# Patient Record
Sex: Female | Born: 1990 | Race: White | Hispanic: No | Marital: Married | Smoking: Never smoker
Health system: Southern US, Community
[De-identification: ages and names within clinical notes are randomized; demographics above are authoritative.]

## PROBLEM LIST (undated history)

## (undated) DIAGNOSIS — G8929 Other chronic pain: Secondary | ICD-10-CM

## (undated) DIAGNOSIS — N200 Calculus of kidney: Secondary | ICD-10-CM

## (undated) DIAGNOSIS — R51 Headache: Secondary | ICD-10-CM

## (undated) DIAGNOSIS — K8020 Calculus of gallbladder without cholecystitis without obstruction: Secondary | ICD-10-CM

## (undated) DIAGNOSIS — O24419 Gestational diabetes mellitus in pregnancy, unspecified control: Secondary | ICD-10-CM

## (undated) HISTORY — DX: Headache: R51

## (undated) HISTORY — PX: CHOLECYSTECTOMY: SHX55

## (undated) HISTORY — DX: Calculus of kidney: N20.0

## (undated) HISTORY — DX: Calculus of gallbladder without cholecystitis without obstruction: K80.20

## (undated) HISTORY — DX: Other chronic pain: G89.29

## (undated) HISTORY — PX: WISDOM TOOTH EXTRACTION: SHX21

---

## 2013-02-06 ENCOUNTER — Encounter: Payer: Self-pay | Admitting: Gastroenterology

## 2013-02-21 ENCOUNTER — Other Ambulatory Visit (INDEPENDENT_AMBULATORY_CARE_PROVIDER_SITE_OTHER): Payer: 59

## 2013-02-21 ENCOUNTER — Encounter: Payer: Self-pay | Admitting: Gastroenterology

## 2013-02-21 ENCOUNTER — Ambulatory Visit (INDEPENDENT_AMBULATORY_CARE_PROVIDER_SITE_OTHER): Payer: 59 | Admitting: Gastroenterology

## 2013-02-21 VITALS — BP 112/70 | HR 80 | Ht 60.0 in | Wt 171.0 lb

## 2013-02-21 DIAGNOSIS — K625 Hemorrhage of anus and rectum: Secondary | ICD-10-CM

## 2013-02-21 DIAGNOSIS — K5900 Constipation, unspecified: Secondary | ICD-10-CM

## 2013-02-21 LAB — CBC WITH DIFFERENTIAL/PLATELET
Basophils Relative: 0.3 % (ref 0.0–3.0)
Eosinophils Relative: 1.6 % (ref 0.0–5.0)
HCT: 39.5 % (ref 36.0–46.0)
Hemoglobin: 13.5 g/dL (ref 12.0–15.0)
Lymphs Abs: 1.5 10*3/uL (ref 0.7–4.0)
MCV: 88.2 fl (ref 78.0–100.0)
Monocytes Absolute: 0.3 10*3/uL (ref 0.1–1.0)
Neutro Abs: 4.1 10*3/uL (ref 1.4–7.7)
Platelets: 271 10*3/uL (ref 150.0–400.0)
WBC: 6 10*3/uL (ref 4.5–10.5)

## 2013-02-21 LAB — BASIC METABOLIC PANEL
BUN: 10 mg/dL (ref 6–23)
Calcium: 9.1 mg/dL (ref 8.4–10.5)
Creatinine, Ser: 0.7 mg/dL (ref 0.4–1.2)
GFR: 107.61 mL/min (ref >60.00)
Glucose, Bld: 121 mg/dL — ABNORMAL HIGH (ref 70–99)
Sodium: 136 mEq/L (ref 135–145)

## 2013-02-21 MED ORDER — LINACLOTIDE 290 MCG PO CAPS: 290.0000 ug | ORAL_CAPSULE | Freq: Every day | ORAL | Status: DC

## 2013-02-21 NOTE — Progress Notes (Signed)
HPI: This is a   very pleasant 22 year old woman who is here with her mother today. I am meeting her for the first time.  GB resected in [facility] about a year ago.  Has gained 50 pounds since then.  Alternating constipation, diarrhea.  A lot of bloating.  Can have to sit and push, strain.   Has tried laxatives, enemas.  She tried linzess for a few days but effecacy seemed to wane.  Always feels constiptated.  Has seen blood in stool at times, small red blood at times.  Not sure if her thyroid level has been checked in past year.  Can have pyrosis as well.  Can effect her daily at times.  She has taken TUMs in past with good relief.   Review of systems: Pertinent positive and negative review of systems were noted in the above HPI section. Complete review of systems was performed and was otherwise normal.    Past Medical History  Diagnosis Date  . Chronic headaches   . Gallstones     Past Surgical History  Procedure Laterality Date  . Cholecystectomy      Current Outpatient Prescriptions  Medication Sig Dispense Refill  . Linaclotide (LINZESS) 145 MCG CAPS Take 145 mcg by mouth daily.      . norgestrel-ethinyl estradiol (LO/OVRAL,CRYSELLE) 0.3-30 MG-MCG tablet Take 1 tablet by mouth daily.       No current facility-administered medications for this visit.    Allergies as of 02/21/2013  . (Not on File)    Family History  Problem Relation Age of Onset  . Breast cancer Paternal Aunt   . Prostate cancer Maternal Grandfather   . Colon cancer Paternal Aunt   . Lung cancer Paternal Aunt   . Lung cancer Maternal Grandmother   . Diabetes Father   . Diabetes Maternal Aunt   . Diabetes Maternal Grandfather   . Heart disease Maternal Grandmother     History   Social History  . Marital Status: Single    Spouse Name: N/A    Number of Children: 0  . Years of Education: N/A   Occupational History  . Not on file.   Social History Main Topics  . Smoking status:  Never Smoker   . Smokeless tobacco: Never Used  . Alcohol Use: No  . Drug Use: No  . Sexually Active: Not on file   Other Topics Concern  . Not on file   Social History Narrative  . No narrative on file       Physical Exam: BP 112/70  Pulse 80  Ht 5' (1.524 m)  Wt 171 lb (77.565 kg)  BMI 33.4 kg/m2  SpO2 98%  LMP 02/12/2013 Constitutional: generally well-appearing Psychiatric: alert and oriented x3 Eyes: extraocular movements intact Mouth: oral pharynx moist, no lesions Neck: supple no lymphadenopathy Cardiovascular: heart regular rate and rhythm Lungs: clear to auscultation bilaterally Abdomen: soft, nontender, nondistended, no obvious ascites, no peritoneal signs, normal bowel sounds Extremities: no lower extremity edema bilaterally Skin: no lesions on visible extremities    Assessment and plan: 22 y.o. female with  chronic constipation, mild intermittent rectal bleeding, extreme weight gain this past year  First I recommended that she try to cut back on her fluid intake since she has gained 50 pounds in past year. I explained this can cause a variety of GI and other systemic symptoms. She has been on linzess at low dose and I'm going to increase this to 290 mcg pills. She'll take  1 pill once daily. She'll also start fiber supplements. We will proceed with flexible sigmoidoscopy at her soonest convenience given her constipation and her intermittent rectal bleeding. She will also get a basic set of labs including thyroid function, CBC, basic metabolic profile.

## 2013-02-21 NOTE — Patient Instructions (Addendum)
You need to try to lose weight, portion control is most helpful. You will have labs checked today in the basement lab.  Please head down after you check out with the front desk  ( tsh level, cbc, bmet). You will be set up for a flexible sigmoidoscopy (LEC, MAC) for constipation, bleeding. New prescription for linzess, higher dose.  Take one pill once daily for constipation. Please start taking citrucel (orange flavored) powder fiber supplement.  This may cause some bloating at first but that usually goes away. Begin with a small spoonful and work your way up to a large, heaping spoonful daily over a week.                                               We are excited to introduce MyChart, a new best-in-class service that provides you online access to important information in your electronic medical record. We want to make it easier for you to view your health information - all in one secure location - when and where you need it. We expect MyChart will enhance the quality of care and service we provide.  When you register for MyChart, you can:    View your test results.    Request appointments and receive appointment reminders via email.    Request medication renewals.    View your medical history, allergies, medications and immunizations.    Communicate with your physician's office through a password-protected site.    Conveniently print information such as your medication lists.  To find out if MyChart is right for you, please talk to a member of our clinical staff today. We will gladly answer your questions about this free health and wellness tool.  If you are age 21 or older and want a member of your family to have access to your record, you must provide written consent by completing a proxy form available at our office. Please speak to our clinical staff about guidelines regarding accounts for patients younger than age 83.  As you activate your MyChart account and need any technical  assistance, please call the MyChart technical support line at [phone]) or email your question to [email or web address]. If you email your question(s), please include your name, a return phone number and the best time to reach you.  If you have non-urgent health-related questions, you can send a message to our office through MyChart at [email or web address]. If you have a medical emergency, call 911.  Thank you for using MyChart as your new health and wellness resource!   MyChart licensed from [employer],  [id number]. Patents Pending.

## 2013-03-24 ENCOUNTER — Ambulatory Visit (AMBULATORY_SURGERY_CENTER): Payer: 59 | Admitting: Gastroenterology

## 2013-03-24 ENCOUNTER — Encounter: Payer: Self-pay | Admitting: Gastroenterology

## 2013-03-24 VITALS — BP 127/77 | HR 64 | Temp 97.5°F | Resp 20 | Ht 60.0 in | Wt 171.0 lb

## 2013-03-24 DIAGNOSIS — K5900 Constipation, unspecified: Secondary | ICD-10-CM

## 2013-03-24 DIAGNOSIS — K625 Hemorrhage of anus and rectum: Secondary | ICD-10-CM

## 2013-03-24 MED ORDER — SODIUM CHLORIDE 0.9 % IV SOLN: 500.0000 mL | INTRAVENOUS | Status: DC

## 2013-03-24 NOTE — Progress Notes (Signed)
Lidocaine-40mg IV prior to Propofol InductionPropofol given over incremental dosages 

## 2013-03-24 NOTE — Op Note (Signed)
[facility] [address] [place], [address]   FLEX SIGMOIDOSCOPY PROCEDURE REPORT  PATIENT: Sherry, Johnson  MR#: [id number] BIRTHDATE: June 04, 1991 , 21  yrs. old GENDER: Female ENDOSCOPIST: Rachael Fee, MD REFERRED XB:JYNWGN John, M.D. PROCEDURE DATE:  03/24/2013 PROCEDURE:   Sigmoidoscopy, diagnostic INDICATIONS:constipation, minor rectal bleeding. MEDICATIONS: MAC sedation, administered by CRNA and propofol (Diprivan) 150mg  IV  DESCRIPTION OF PROCEDURE:    Physical exam was performed.  Informed consent was obtained from the patient after explaining the benefits, risks, and alternatives to procedure.  The patient was connected to monitor and placed in left lateral position. Continuous oxygen was provided by nasal cannula and IV medicine administered through an indwelling cannula.  After administration of sedation and rectal exam, the patients rectum was intubated and the [id number]  colonoscope was advanced under direct visualization to the transverse colon.  The scope was removed slowly by carefully examining the color, texture, anatomy, and integrity mucosa on the way out.  The patient was recovered in endoscopy and discharged home in satisfactory condition.    COLON FINDINGS: The examination was normal.  PREP QUALITY: good  CECAL W/D TIME: NA  COMPLICATIONS: None  ENDOSCOPIC IMPRESSION: The examination was normal.   RECOMMENDATIONS: Please continue once daily linzess and try to take citrucel on a daily basis as well.  My office will contact you about a follow up appt in 4-5 weeks.       _______________________________ eSignedRachael Fee, MD 03/24/2013 8:58 AM

## 2013-03-24 NOTE — Patient Instructions (Addendum)

## 2013-03-24 NOTE — Progress Notes (Signed)
Patient did not experience any of the following events: a burn prior to discharge; a fall within the facility; wrong site/side/patient/procedure/implant event; or a hospital transfer or hospital admission upon discharge from the facility. (G8907) Patient did not have preoperative order for IV antibiotic SSI prophylaxis. (G8918)  

## 2013-03-27 ENCOUNTER — Telehealth: Payer: Self-pay | Admitting: *Deleted

## 2013-03-27 NOTE — Telephone Encounter (Signed)
  Follow up Call-  Call back number 03/24/2013  Post procedure Call Back phone  # [phone]  Permission to leave phone message Yes     Patient questions:  Do you have a fever, pain , or abdominal swelling? no Pain Score  0 *  Have you tolerated food without any problems? yes  Have you been able to return to your normal activities? yes  Do you have any questions about your discharge instructions: Diet   no Medications  no Follow up visit  no  Do you have questions or concerns about your Care? no  Actions: * If pain score is 4 or above: No action needed, pain <4.

## 2013-04-23 DIAGNOSIS — N200 Calculus of kidney: Secondary | ICD-10-CM

## 2013-04-23 HISTORY — DX: Calculus of kidney: N20.0

## 2013-04-25 ENCOUNTER — Ambulatory Visit: Payer: 59 | Admitting: Gastroenterology

## 2013-04-25 ENCOUNTER — Telehealth: Payer: Self-pay | Admitting: Gastroenterology

## 2013-04-25 NOTE — Telephone Encounter (Signed)
Do not bill 

## 2013-04-25 NOTE — Telephone Encounter (Signed)
Error

## 2013-05-11 ENCOUNTER — Emergency Department (HOSPITAL_COMMUNITY)
Admission: EM | Admit: 2013-05-11 | Discharge: 2013-05-11 | Disposition: A | Discharge status: Home / Self Care | Payer: 59 | Attending: Emergency Medicine | Admitting: Emergency Medicine

## 2013-05-11 ENCOUNTER — Encounter (HOSPITAL_COMMUNITY): Payer: Self-pay | Admitting: Emergency Medicine

## 2013-05-11 ENCOUNTER — Emergency Department (HOSPITAL_COMMUNITY): Payer: 59

## 2013-05-11 DIAGNOSIS — Z9889 Other specified postprocedural states: Secondary | ICD-10-CM | POA: Insufficient documentation

## 2013-05-11 DIAGNOSIS — R6883 Chills (without fever): Secondary | ICD-10-CM | POA: Insufficient documentation

## 2013-05-11 DIAGNOSIS — R110 Nausea: Secondary | ICD-10-CM | POA: Insufficient documentation

## 2013-05-11 DIAGNOSIS — Z8669 Personal history of other diseases of the nervous system and sense organs: Secondary | ICD-10-CM | POA: Insufficient documentation

## 2013-05-11 DIAGNOSIS — Z3202 Encounter for pregnancy test, result negative: Secondary | ICD-10-CM | POA: Insufficient documentation

## 2013-05-11 DIAGNOSIS — R197 Diarrhea, unspecified: Secondary | ICD-10-CM | POA: Insufficient documentation

## 2013-05-11 DIAGNOSIS — N201 Calculus of ureter: Secondary | ICD-10-CM

## 2013-05-11 DIAGNOSIS — R61 Generalized hyperhidrosis: Secondary | ICD-10-CM | POA: Insufficient documentation

## 2013-05-11 DIAGNOSIS — N200 Calculus of kidney: Secondary | ICD-10-CM | POA: Insufficient documentation

## 2013-05-11 DIAGNOSIS — Z8719 Personal history of other diseases of the digestive system: Secondary | ICD-10-CM | POA: Insufficient documentation

## 2013-05-11 LAB — URINE MICROSCOPIC-ADD ON

## 2013-05-11 LAB — URINALYSIS, ROUTINE W REFLEX MICROSCOPIC
Glucose, UA: NEGATIVE mg/dL
Ketones, ur: 15 mg/dL — AB
Protein, ur: 30 mg/dL — AB
pH: 6 (ref 5.0–8.0)

## 2013-05-11 LAB — POCT I-STAT, CHEM 8
BUN: 9 mg/dL (ref 6–23)
Calcium, Ion: 1.21 mmol/L (ref 1.12–1.23)
Chloride: 107 mEq/L (ref 96–112)
Glucose, Bld: 157 mg/dL — ABNORMAL HIGH (ref 70–99)
Potassium: 4.4 mEq/L (ref 3.5–5.1)

## 2013-05-11 LAB — POCT PREGNANCY, URINE: Preg Test, Ur: NEGATIVE

## 2013-05-11 IMAGING — CT CT ABD-PELV W/O CM
2 of 4 series · 17 of 46 positions shown, 19 images · non-contrast
Comparison: None.

CLINICAL DATA: Left flank pain.  Hematuria.  Question stone.

CT ABDOMEN AND PELVIS WITHOUT CONTRAST
TECHNIQUE: Multidetector CT imaging of the abdomen and pelvis was
performed following the standard protocol without intravenous
contrast.

[Series 2: stone 160 5.0 b31f st · axial · 0.78mm/px · z∈[-476,-61]mm · 14 of 91 slices shown, 16 images]
[im 4/91  soft-tissue]
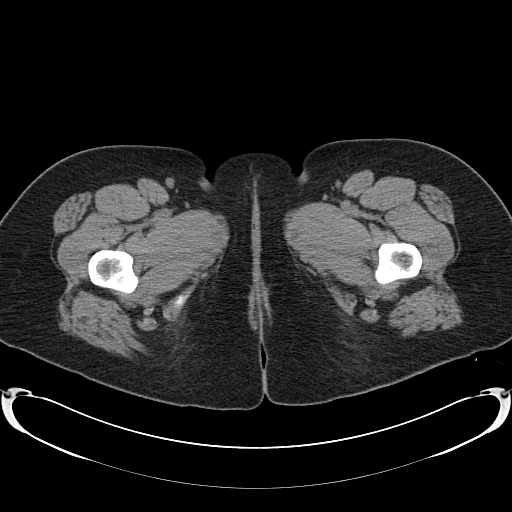
[im 4/91  bone]
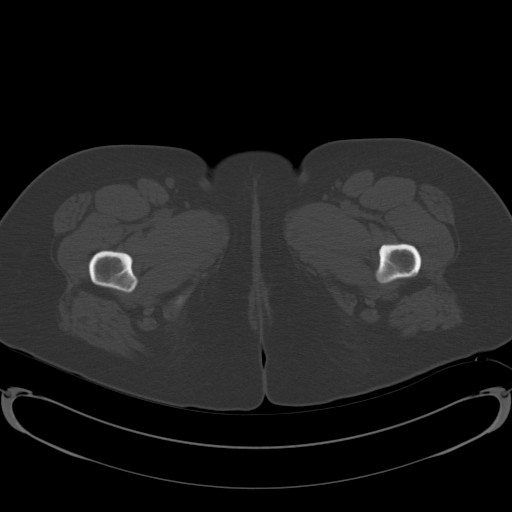
[im 12/91  soft-tissue]
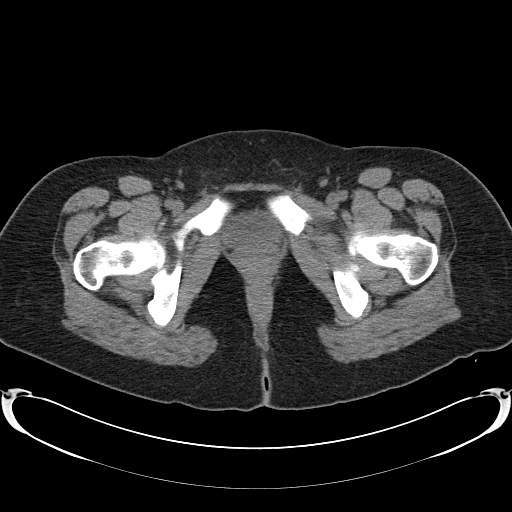
[im 19/91  soft-tissue]
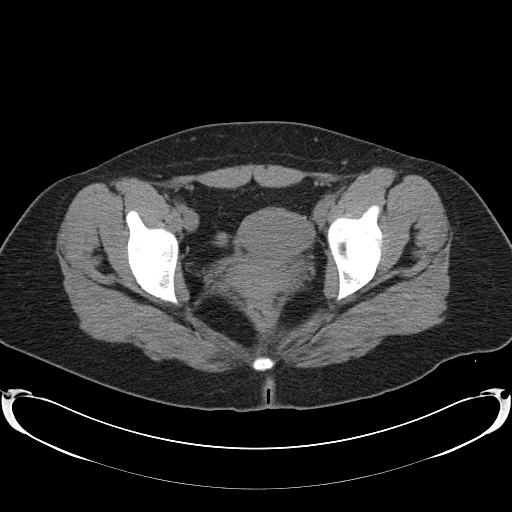
[im 23/91  soft-tissue]
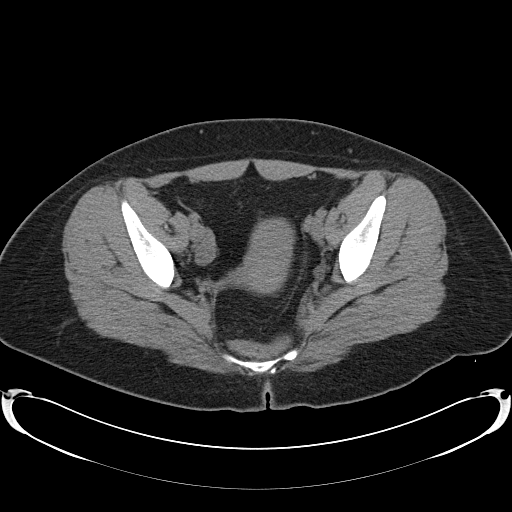
[im 31/91  soft-tissue]
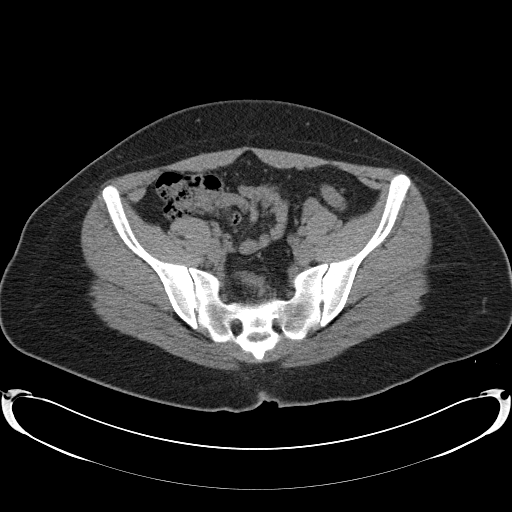
[im 38/91  soft-tissue]
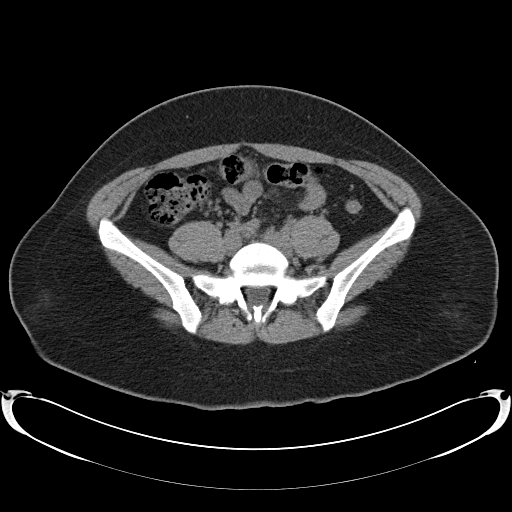
[im 42/91  soft-tissue]
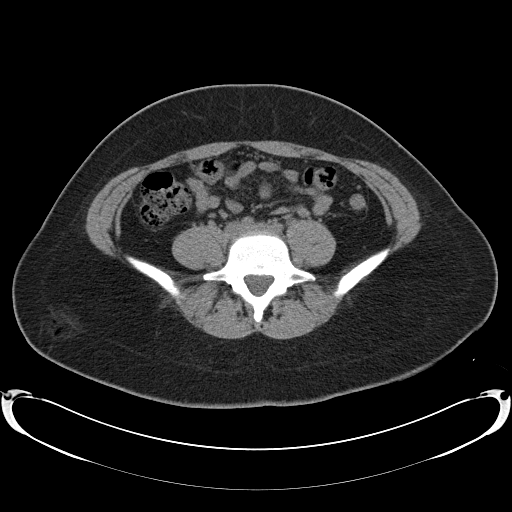
[im 49/91  soft-tissue]
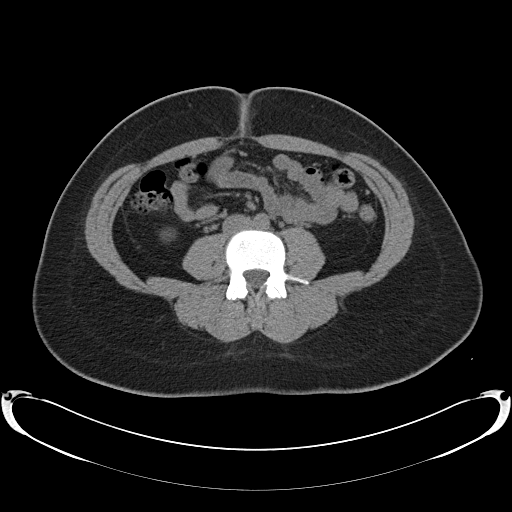
[im 53/91  soft-tissue]
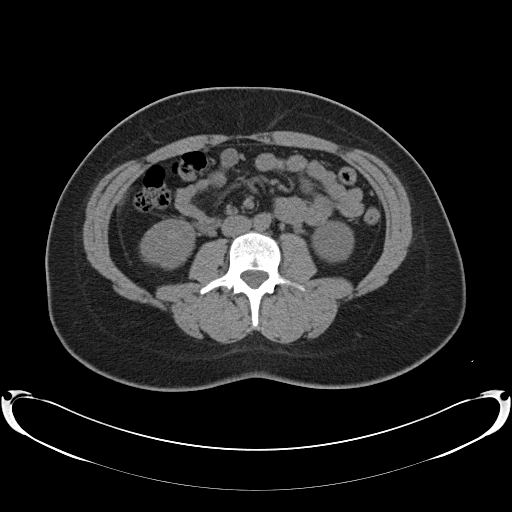
[im 53/91  bone]
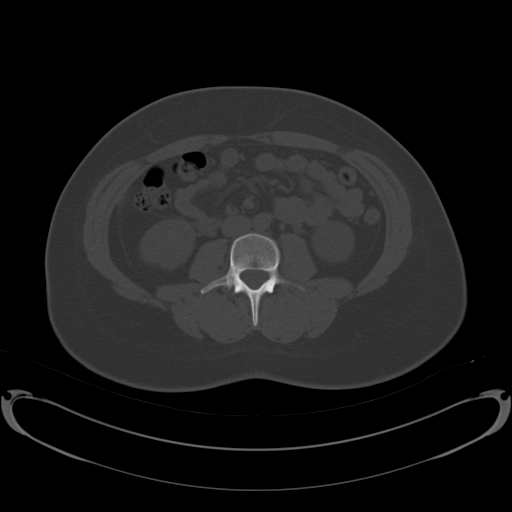
[im 61/91  soft-tissue]
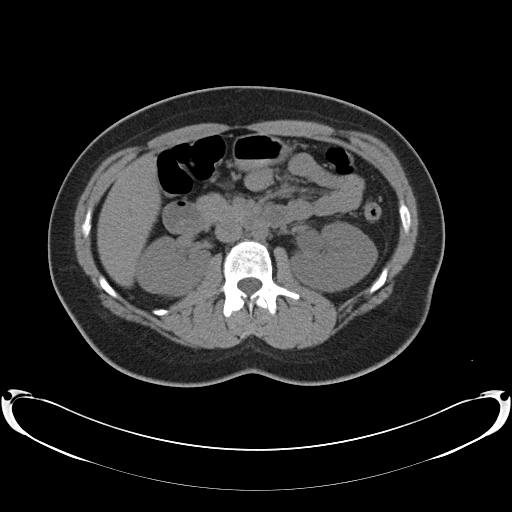
[im 68/91  soft-tissue]
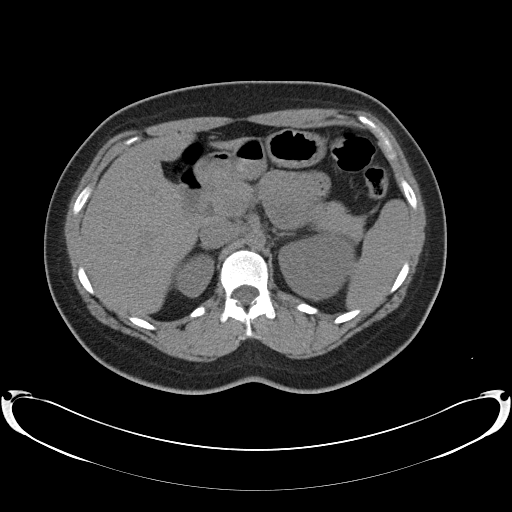
[im 72/91  soft-tissue]
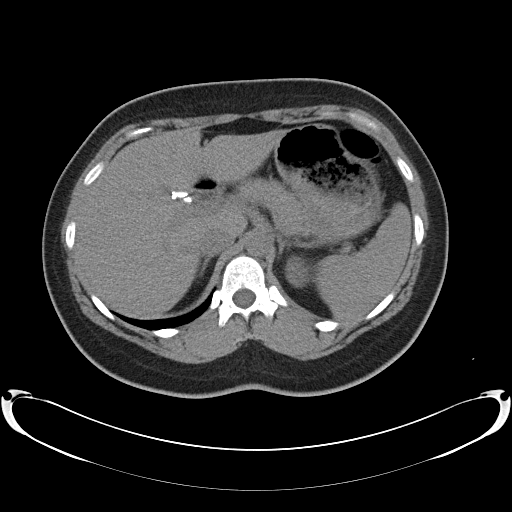
[im 79/91  soft-tissue]
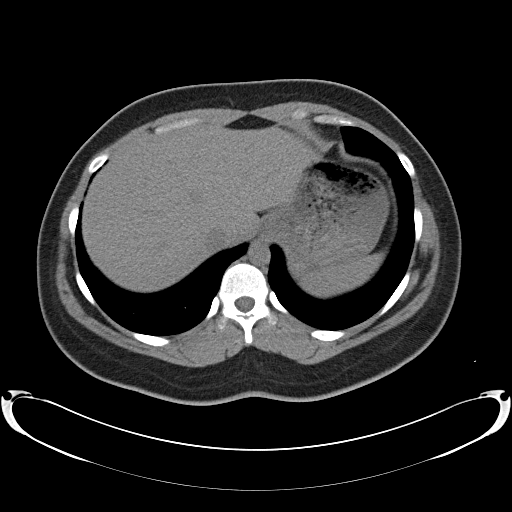
[im 87/91  soft-tissue]
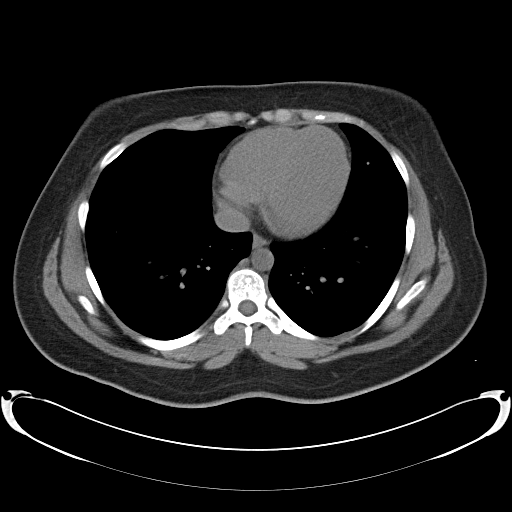

[Series 5: coronals · coronal · 0.91mm/px · 3 of 97 slices shown]
[im 33/97  soft-tissue]
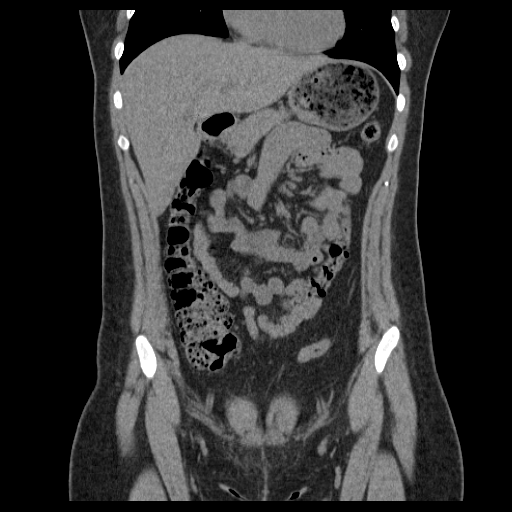
[im 43/97  soft-tissue]
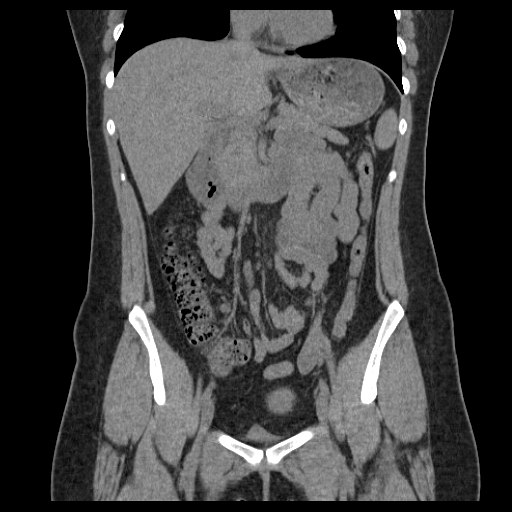
[im 54/97  soft-tissue]
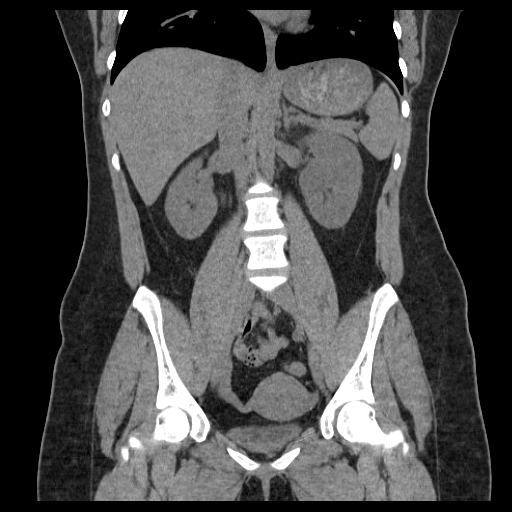

[17 of 46 positions shown; findings below may reference images not displayed]

FINDINGS: The lung bases are clear.

2 mm distal left ureteral stone near the ureterovesical junction
results in mild left hydroureteronephrosis.

The both kidneys are normal in size.  No intra renal calculi
identified.

There is a small focal area of wedge-shaped cortical thinning in
the posterior upper pole right kidney that likely reflects cortical
scarring, possibly from prior infection or vesicoureteral reflux.
There is no hydronephrosis on the right.  The right ureter is
normal in caliber.

The urinary bladder is nearly completely decompressed, and
unremarkable.

The noncontrast appearance of the liver, spleen, adrenal glands,
and pancreas is within normal limits.  Cholecystectomy. Common bile
duct within normal limits for caliber.

Abdominal aorta normal in caliber.

The stomach, small bowel loops, and colon are within normal limits.
The appendix is well visualized and normal.  The uterus and adnexa
have normal noncontrast CT appearance.

There are some subcutaneous locules of gas in the lower
back/superior buttocks bilaterally, suggesting sequela of
subcutaneous injections.

No acute or suspicious bony abnormality.
IMPRESSION: 1.  2 mm distal left ureteral stone near the ureterovesical
junction results in mild proximal left hydroureteronephrosis.
2. Small focal area of cortical scarring in the upper pole of the
right kidney.

## 2013-05-11 MED ORDER — MORPHINE SULFATE 4 MG/ML IJ SOLN
4.0000 mg | Freq: Once | INTRAMUSCULAR | Status: AC
  Administered 2013-05-11: 4 mg via INTRAVENOUS
  Filled 2013-05-11: qty 1

## 2013-05-11 MED ORDER — TAMSULOSIN HCL 0.4 MG PO CAPS: 0.4000 mg | ORAL_CAPSULE | Freq: Every day | ORAL | Status: DC

## 2013-05-11 MED ORDER — ONDANSETRON HCL 4 MG/2ML IJ SOLN
4.0000 mg | Freq: Once | INTRAMUSCULAR | Status: AC
  Administered 2013-05-11: 4 mg via INTRAVENOUS
  Filled 2013-05-11: qty 2

## 2013-05-11 MED ORDER — FENTANYL CITRATE 0.05 MG/ML IJ SOLN
50.0000 ug | Freq: Once | INTRAMUSCULAR | Status: AC
  Administered 2013-05-11: 50 ug via INTRAVENOUS
  Filled 2013-05-11: qty 2

## 2013-05-11 MED ORDER — PROMETHAZINE HCL 25 MG PO TABS: 25.0000 mg | ORAL_TABLET | Freq: Four times a day (QID) | ORAL | Status: DC | PRN

## 2013-05-11 MED ORDER — OXYCODONE-ACETAMINOPHEN 5-325 MG PO TABS: 1.0000 | ORAL_TABLET | ORAL | Status: DC | PRN

## 2013-05-11 NOTE — ED Provider Notes (Signed)
History    This chart was scribed for Trixie Dredge, non-physician practitioner working with Flint Melter, MD by Leone Payor, ED Scribe. This patient was seen in room TR10C/TR10C and the patient's care was started at 1757.   CSN: [id number]  Arrival date & time 05/11/13  1757   First MD Initiated Contact with Patient 05/11/13 1918      Chief Complaint  Patient presents with  . Flank Pain     The history is provided by the patient. No language interpreter was used.    HPI Comments: Sherry Johnson is a 22 y.o. female who presents to the Emergency Department complaining of sudden, constant, unchanged, L sided flank pain starting today. Pt was seen at Urgent Care in [place] and was told to come here for a possible kidney stone. She was given pain medicine which relieved the pain but now the pain is returning. She has associated sweating, chills, nausea. Pain is worse with movement.Denies h/o ovary cysts or ovarian problems, no hx kidney stones.  LMP was last week, was normal and on time. She was seen by OB-GYN last month when they found blood in her urine. She denies vomiting, diarrhea, dysuria, urinary frequency, or urgency, abnormal vaginal bleeding, abnormal vaginal discharge. Pt has h/o cholecystectomy.      Past Medical History  Diagnosis Date  . Chronic headaches   . Gallstones     Past Surgical History  Procedure Laterality Date  . Cholecystectomy      Family History  Problem Relation Age of Onset  . Breast cancer Paternal Aunt   . Prostate cancer Maternal Grandfather   . Diabetes Maternal Grandfather   . Colon cancer Paternal Aunt   . Lung cancer Paternal Aunt   . Lung cancer Maternal Grandmother   . Heart disease Maternal Grandmother   . Diabetes Father   . Diabetes Maternal Aunt     History  Substance Use Topics  . Smoking status: Never Smoker   . Smokeless tobacco: Never Used  . Alcohol Use: No    OB History   Grav Para Term Preterm Abortions TAB SAB Ect  Mult Living                  Review of Systems  Constitutional: Positive for chills and diaphoresis.  Gastrointestinal: Positive for nausea and diarrhea. Negative for vomiting.  Genitourinary: Positive for flank pain. Negative for dysuria, urgency, frequency, vaginal bleeding, vaginal discharge and difficulty urinating.    Allergies  Review of patient's allergies indicates no known allergies.  Home Medications   Current Outpatient Rx  Name  Route  Sig  Dispense  Refill  . Fiber TABS   Oral   Take 2 tablets by mouth every morning.         . Linaclotide 290 MCG CAPS   Oral   Take 290 mcg by mouth daily before breakfast.   30 capsule   4   . norgestrel-ethinyl estradiol (LO/OVRAL,CRYSELLE) 0.3-30 MG-MCG tablet   Oral   Take 1 tablet by mouth at bedtime.            BP 124/81  Pulse 74  Temp(Src) 98 F (36.7 C) (Oral)  Resp 18  SpO2 100%  Physical Exam  Nursing note and vitals reviewed. Constitutional: She appears well-developed and well-nourished. No distress.  HENT:  Head: Normocephalic and atraumatic.  Neck: Neck supple.  Cardiovascular: Normal rate and regular rhythm.   Pulmonary/Chest: Effort normal and breath sounds normal. No respiratory distress.  She has no wheezes. She has no rales.  Abdominal: Soft. She exhibits no distension. There is CVA tenderness. There is no rebound and no guarding.  Very mild diffuse left sided tenderness.  Pt jumps with CVA percussion bilaterally.   Neurological: She is alert.  Skin: She is not diaphoretic.    ED Course  Procedures (including critical care time)  DIAGNOSTIC STUDIES: Oxygen Saturation is 100% on RA, normal by my interpretation.    COORDINATION OF CARE: 7:44 PM Discussed treatment plan with pt at bedside and pt agreed to plan.   Labs Reviewed  URINALYSIS, ROUTINE W REFLEX MICROSCOPIC - Abnormal; Notable for the following:    Color, Urine AMBER (*)    APPearance CLOUDY (*)    Specific Gravity, Urine  1.037 (*)    Hgb urine dipstick SMALL (*)    Bilirubin Urine SMALL (*)    Ketones, ur 15 (*)    Protein, ur 30 (*)    Leukocytes, UA SMALL (*)    All other components within normal limits  URINE MICROSCOPIC-ADD ON - Abnormal; Notable for the following:    Squamous Epithelial / LPF MANY (*)    Bacteria, UA FEW (*)    Crystals CA OXALATE CRYSTALS (*)    All other components within normal limits  POCT I-STAT, CHEM 8 - Abnormal; Notable for the following:    Glucose, Bld 157 (*)    All other components within normal limits  URINE CULTURE  POCT PREGNANCY, URINE   Ct Abdomen Pelvis Wo Contrast  05/11/2013   *RADIOLOGY REPORT*  Clinical Data: Left flank pain.  Hematuria.  Question stone.  CT ABDOMEN AND PELVIS WITHOUT CONTRAST  Technique:  Multidetector CT imaging of the abdomen and pelvis was performed following the standard protocol without intravenous contrast.  Comparison: None.  Findings: The lung bases are clear.  2 mm distal left ureteral stone near the ureterovesical junction results in mild left hydroureteronephrosis.  The both kidneys are normal in size.  No intra renal calculi identified.  There is a small focal area of wedge-shaped cortical thinning in the posterior upper pole right kidney that likely reflects cortical scarring, possibly from prior infection or vesicoureteral reflux. There is no hydronephrosis on the right.  The right ureter is normal in caliber.  The urinary bladder is nearly completely decompressed, and unremarkable.  The noncontrast appearance of the liver, spleen, adrenal glands, and pancreas is within normal limits.  Cholecystectomy. Common bile duct within normal limits for caliber.  Abdominal aorta normal in caliber.  The stomach, small bowel loops, and colon are within normal limits. The appendix is well visualized and normal.  The uterus and adnexa have normal noncontrast CT appearance.  There are some subcutaneous locules of gas in the lower back/superior buttocks  bilaterally, suggesting sequela of subcutaneous injections.  No acute or suspicious bony abnormality.  IMPRESSION:  1.  2 mm distal left ureteral stone near the ureterovesical junction results in mild proximal left hydroureteronephrosis. 2. Small focal area of cortical scarring in the upper pole of the right kidney.   Original Report Authenticated By: Britta Mccreedy, M.D.     1. Ureteral stone      MDM  Pt with left flank pain, evaluated by outside urgent care and sent to ED for evaluation for possible kidney stone.  Found to have 2mm ureteral stone.  No e/o UTI on UA - sent for culture.  Pain and nausea controlled in ED.  Labs show hyperglycemia, otherwise unremarkable.  Discussed results with patient.  Pt given return precautions.  Pt verbalizes understanding and agrees with plan.      I personally performed the services described in this documentation, which was scribed in my presence. The recorded information has been reviewed and is accurate.   Trixie Dredge, PA-C 05/11/13 2251

## 2013-05-11 NOTE — ED Notes (Signed)
Pt c/o left sided flank pain; pt went to [facility] and told to come here for possible kidney stone; pt had blood in urine

## 2013-05-12 LAB — URINE CULTURE: Culture: NO GROWTH

## 2013-05-12 NOTE — ED Provider Notes (Signed)
Medical screening examination/treatment/procedure(s) were performed by non-physician practitioner and as supervising physician I was immediately available for consultation/collaboration.  Flint Melter, MD 05/12/13 [phone]

## 2013-05-17 ENCOUNTER — Ambulatory Visit (INDEPENDENT_AMBULATORY_CARE_PROVIDER_SITE_OTHER): Payer: 59 | Admitting: Gastroenterology

## 2013-05-17 ENCOUNTER — Encounter: Payer: Self-pay | Admitting: Gastroenterology

## 2013-05-17 VITALS — BP 100/60 | HR 60 | Ht 60.0 in | Wt 162.0 lb

## 2013-05-17 DIAGNOSIS — K5900 Constipation, unspecified: Secondary | ICD-10-CM

## 2013-05-17 NOTE — Patient Instructions (Addendum)
Continue slow gradual weight loss. Continue linzess at , will refill this for 2 year period without need to return to office during that time if all is going well.

## 2013-05-17 NOTE — Progress Notes (Signed)
Review of pertinent gastrointestinal problems: 1. Chronic constipation, functional;  Flex sig (Jamiee Milholland) 03/2013 was normal to transverse colon.  02/1013 cbc, bmet, tsh were all normal.  She had gained 50 pounds from 2013 to 2014.  Linzess started and really helped her symptoms.  HPI: This is a   very pleasant 22 year old woman whom I last saw about a month ago at the time of the flexible sigmoidoscopy.  She likes the linzess.   Would go maybe twice a week, a lot of pushing straining.  On high dose linzess she moves her bowels daily.  Also takes fiber choice pills or citrucel daily.   She has lost 8 pounds since last visit.  Stools can be loose at times, but she is overall very happy.   Past Medical History  Diagnosis Date  . Chronic headaches   . Gallstones   . Kidney stone 04/2013    Past Surgical History  Procedure Laterality Date  . Cholecystectomy      Current Outpatient Prescriptions  Medication Sig Dispense Refill  . Fiber TABS Take 2 tablets by mouth every morning.      . Linaclotide 290 MCG CAPS Take 290 mcg by mouth daily before breakfast.  30 capsule  4  . norgestrel-ethinyl estradiol (LO/OVRAL,CRYSELLE) 0.3-30 MG-MCG tablet Take 1 tablet by mouth at bedtime.       Marland Kitchen oxyCODONE-acetaminophen (PERCOCET/ROXICET) 5-325 MG per tablet Take 1-2 tablets by mouth every 4 (four) hours as needed for pain.  20 tablet  0  . promethazine (PHENERGAN) 25 MG tablet Take 1 tablet (25 mg total) by mouth every 6 (six) hours as needed for nausea.  20 tablet  0   No current facility-administered medications for this visit.    Allergies as of 05/17/2013  . (No Known Allergies)    Family History  Problem Relation Age of Onset  . Breast cancer Paternal Aunt   . Prostate cancer Maternal Grandfather   . Diabetes Maternal Grandfather   . Colon cancer Paternal Aunt   . Lung cancer Paternal Aunt   . Lung cancer Maternal Grandmother   . Heart disease Maternal Grandmother   . Diabetes Father    . Diabetes Maternal Aunt     History   Social History  . Marital Status: Single    Spouse Name: N/A    Number of Children: 0  . Years of Education: N/A   Occupational History  . sales associate    Social History Main Topics  . Smoking status: Never Smoker   . Smokeless tobacco: Never Used  . Alcohol Use: No  . Drug Use: No  . Sexually Active: Not on file   Other Topics Concern  . Not on file   Social History Narrative  . No narrative on file      Physical Exam: BP 100/60  Pulse 60  Ht 5' (1.524 m)  Wt 162 lb (73.483 kg)  BMI 31.64 kg/m2  LMP 05/03/2013 Constitutional: generally well-appearing Psychiatric: alert and oriented x3 Abdomen: soft, nontender, nondistended, no obvious ascites, no peritoneal signs, normal bowel sounds     Assessment and plan: 22 y.o. female with  chronic constipation  Her bowels moved much more regularly on combination linzess and daily fiber supplement she'll continue this regimen indefinitely. I will be happy to refill her prescription over the next 2 years without need for return visit but I think I would like to see her in about 2 years from now even if all is going well.  She knows in happy to see her sooner if needed.

## 2013-07-19 ENCOUNTER — Other Ambulatory Visit: Payer: Self-pay | Admitting: Gastroenterology

## 2014-01-18 ENCOUNTER — Other Ambulatory Visit: Payer: Self-pay | Admitting: Gastroenterology

## 2014-06-07 ENCOUNTER — Encounter (HOSPITAL_COMMUNITY): Payer: Self-pay | Admitting: Emergency Medicine

## 2014-06-07 DIAGNOSIS — Z8719 Personal history of other diseases of the digestive system: Secondary | ICD-10-CM | POA: Diagnosis not present

## 2014-06-07 DIAGNOSIS — R51 Headache: Secondary | ICD-10-CM | POA: Insufficient documentation

## 2014-06-07 DIAGNOSIS — Z79899 Other long term (current) drug therapy: Secondary | ICD-10-CM | POA: Diagnosis not present

## 2014-06-07 DIAGNOSIS — N200 Calculus of kidney: Secondary | ICD-10-CM | POA: Diagnosis present

## 2014-06-07 DIAGNOSIS — Z3202 Encounter for pregnancy test, result negative: Secondary | ICD-10-CM | POA: Insufficient documentation

## 2014-06-07 LAB — COMPREHENSIVE METABOLIC PANEL
ALT: 15 U/L (ref 0–35)
ANION GAP: 19 — AB (ref 5–15)
AST: 19 U/L (ref 0–37)
Albumin: 4.4 g/dL (ref 3.5–5.2)
Alkaline Phosphatase: 69 U/L (ref 39–117)
BUN: 8 mg/dL (ref 6–23)
CALCIUM: 9.7 mg/dL (ref 8.4–10.5)
CO2: 23 mEq/L (ref 19–32)
Chloride: 101 mEq/L (ref 96–112)
Creatinine, Ser: 0.72 mg/dL (ref 0.50–1.10)
GFR calc non Af Amer: >90 mL/min (ref >90)
GLUCOSE: 111 mg/dL — AB (ref 70–99)
Potassium: 3.7 mEq/L (ref 3.7–5.3)
Sodium: 143 mEq/L (ref 137–147)
TOTAL PROTEIN: 7.8 g/dL (ref 6.0–8.3)
Total Bilirubin: 0.4 mg/dL (ref 0.3–1.2)

## 2014-06-07 LAB — CBC WITH DIFFERENTIAL/PLATELET
BASOS ABS: 0 10*3/uL (ref 0.0–0.1)
Basophils Relative: 0 % (ref 0–1)
EOS ABS: 0.1 10*3/uL (ref 0.0–0.7)
EOS PCT: 1 % (ref 0–5)
HCT: 42.2 % (ref 36.0–46.0)
HEMOGLOBIN: 14.1 g/dL (ref 12.0–15.0)
Lymphocytes Relative: 30 % (ref 12–46)
Lymphs Abs: 2.7 10*3/uL (ref 0.7–4.0)
MCH: 30.5 pg (ref 26.0–34.0)
MCHC: 33.4 g/dL (ref 30.0–36.0)
MCV: 91.1 fL (ref 78.0–100.0)
MONO ABS: 0.6 10*3/uL (ref 0.1–1.0)
Monocytes Relative: 6 % (ref 3–12)
Neutro Abs: 5.5 10*3/uL (ref 1.7–7.7)
Neutrophils Relative %: 63 % (ref 43–77)
PLATELETS: 266 10*3/uL (ref 150–400)
RBC: 4.63 MIL/uL (ref 3.87–5.11)
RDW: 12.8 % (ref 11.5–15.5)
WBC: 8.9 10*3/uL (ref 4.0–10.5)

## 2014-06-07 MED ORDER — ONDANSETRON HCL 4 MG/2ML IJ SOLN
4.0000 mg | Freq: Once | INTRAMUSCULAR | Status: AC
  Administered 2014-06-07: 4 mg via INTRAVENOUS
  Filled 2014-06-07: qty 2

## 2014-06-07 MED ORDER — MORPHINE SULFATE 2 MG/ML IJ SOLN
4.0000 mg | Freq: Once | INTRAMUSCULAR | Status: AC
  Administered 2014-06-07: 4 mg via INTRAVENOUS
  Filled 2014-06-07: qty 2

## 2014-06-07 NOTE — ED Notes (Addendum)
Pain for 3-4 days but worsening today. Actively vomiting in triage. Right flank pain; hx kidney stones. Pain feels same as when she had stones. Pt is diaphoretic and flushed.

## 2014-06-08 ENCOUNTER — Emergency Department (HOSPITAL_COMMUNITY)
Admission: EM | Admit: 2014-06-08 | Discharge: 2014-06-08 | Disposition: A | Discharge status: Home / Self Care | Payer: 59 | Attending: Emergency Medicine | Admitting: Emergency Medicine

## 2014-06-08 DIAGNOSIS — N200 Calculus of kidney: Secondary | ICD-10-CM

## 2014-06-08 LAB — URINALYSIS, ROUTINE W REFLEX MICROSCOPIC
BILIRUBIN URINE: NEGATIVE
Glucose, UA: NEGATIVE mg/dL
KETONES UR: 15 mg/dL — AB
NITRITE: NEGATIVE
PH: 6 (ref 5.0–8.0)
PROTEIN: NEGATIVE mg/dL
Specific Gravity, Urine: 1.021 (ref 1.005–1.030)
Urobilinogen, UA: 1 mg/dL (ref 0.0–1.0)

## 2014-06-08 LAB — URINE MICROSCOPIC-ADD ON

## 2014-06-08 LAB — PREGNANCY, URINE: PREG TEST UR: NEGATIVE

## 2014-06-08 MED ORDER — ONDANSETRON HCL 4 MG/2ML IJ SOLN
4.0000 mg | Freq: Once | INTRAMUSCULAR | Status: AC
Start: 2014-06-08 — End: 2014-06-08
  Administered 2014-06-08: 4 mg via INTRAVENOUS
  Filled 2014-06-08: qty 2

## 2014-06-08 MED ORDER — ONDANSETRON 4 MG PO TBDP: 4.0000 mg | ORAL_TABLET | Freq: Three times a day (TID) | ORAL | Status: DC | PRN

## 2014-06-08 MED ORDER — IBUPROFEN 600 MG PO TABS
600.0000 mg | ORAL_TABLET | Freq: Four times a day (QID) | ORAL | Status: DC | PRN
Start: 2014-06-08 — End: 2015-08-02

## 2014-06-08 MED ORDER — OXYCODONE-ACETAMINOPHEN 5-325 MG PO TABS
1.0000 | ORAL_TABLET | Freq: Once | ORAL | Status: AC
  Administered 2014-06-08: 1 via ORAL
  Filled 2014-06-08: qty 1

## 2014-06-08 MED ORDER — OXYCODONE-ACETAMINOPHEN 5-325 MG PO TABS: 1.0000 | ORAL_TABLET | Freq: Four times a day (QID) | ORAL | Status: DC | PRN

## 2014-06-08 NOTE — Discharge Instructions (Signed)
Kidney Stones Kidney stones (urolithiasis) are solid masses that form inside your kidneys. The intense pain is caused by the stone moving through the kidney, ureter, bladder, and urethra (urinary tract). When the stone moves, the ureter starts to spasm around the stone. The stone is usually passed in your pee (urine).  HOME CARE  Drink enough fluids to keep your pee clear or pale yellow. This helps to get the stone out.  Strain all pee through the provided strainer. Do not pee without peeing through the strainer, not even once. If you pee the stone out, catch it in the strainer. The stone may be as small as a grain of salt. Take this to your doctor. This will help your doctor figure out what you can do to try to prevent more kidney stones.  Only take medicine as told by your doctor.  Follow up with your doctor as told.  Get follow-up X-rays as told by your doctor. GET HELP IF: You have pain that gets worse even if you have been taking pain medicine. GET HELP RIGHT AWAY IF:   Your pain does not get better with medicine.  You have a fever or shaking chills.  Your pain increases and gets worse over 18 hours.  You have new belly (abdominal) pain.  You feel faint or pass out.  You are unable to pee. MAKE SURE YOU:   Understand these instructions.  Will watch your condition.  Will get help right away if you are not doing well or get worse. Document Released: 04/27/2008 Document Revised: 07/12/2013 Document Reviewed: 04/12/2013 [facility] Patient Information 2015 [employer] [place]. This information is not intended to replace advice given to you by your health care provider. Make sure you discuss any questions you have with your health care provider. Return if you develop new symptoms, fever, vomiting not controlled by the Zofran and pan that can not be controlled at home   Follow up with your PCP as needed

## 2014-06-08 NOTE — ED Provider Notes (Signed)
Medical screening examination/treatment/procedure(s) were performed by non-physician practitioner and as supervising physician I was immediately available for consultation/collaboration.   Dione Boozeavid Lindzie Boxx, MD 06/08/14 [phone]

## 2014-06-08 NOTE — ED Provider Notes (Signed)
CSN: [id number]     Arrival date & time 06/07/14  2118 History   First MD Initiated Contact with Patient 06/08/14 0040     Chief Complaint  Patient presents with  . Nephrolithiasis     (Consider location/radiation/quality/duration/timing/severity/associated sxs/prior Treatment) HPI Comments: Hx of kidney stones X1 now with same symptoms back pain waxing and waning in intensity   The history is provided by the patient.    Past Medical History  Diagnosis Date  . Chronic headaches   . Gallstones   . Kidney stone 04/2013   Past Surgical History  Procedure Laterality Date  . Cholecystectomy     Family History  Problem Relation Age of Onset  . Breast cancer Paternal Aunt   . Prostate cancer Maternal Grandfather   . Diabetes Maternal Grandfather   . Colon cancer Paternal Aunt   . Lung cancer Paternal Aunt   . Lung cancer Maternal Grandmother   . Heart disease Maternal Grandmother   . Diabetes Father   . Diabetes Maternal Aunt    History  Substance Use Topics  . Smoking status: Never Smoker   . Smokeless tobacco: Never Used  . Alcohol Use: No   OB History   Grav Para Term Preterm Abortions TAB SAB Ect Mult Living                 Review of Systems  Constitutional: Negative for fever and chills.  Gastrointestinal: Positive for nausea. Negative for vomiting, diarrhea and constipation.  Genitourinary: Positive for flank pain. Negative for dysuria, hematuria, vaginal bleeding and vaginal discharge.  All other systems reviewed and are negative.     Allergies  Review of patient's allergies indicates no known allergies.  Home Medications   Prior to Admission medications   Medication Sig Start Date End Date Taking? Authorizing Provider  Fiber TABS Take 2 tablets by mouth every morning.    Historical Provider, MD  ibuprofen (ADVIL,MOTRIN) 600 MG tablet Take 1 tablet (600 mg total) by mouth every 6 (six) hours as needed. 06/08/14   Arman Filter, NP  LINZESS 290 MCG CAPS  capsule TAKE 290 MCG BY MOUTH DAILY BEFORE BREAKFAST.    Rachael Fee, MD  norgestrel-ethinyl estradiol (LO/OVRAL,CRYSELLE) 0.3-30 MG-MCG tablet Take 1 tablet by mouth at bedtime.     Historical Provider, MD  ondansetron (ZOFRAN ODT) 4 MG disintegrating tablet Take 1 tablet (4 mg total) by mouth every 8 (eight) hours as needed for nausea or vomiting. 06/08/14   Arman Filter, NP  oxyCODONE-acetaminophen (PERCOCET/ROXICET) 5-325 MG per tablet Take 1-2 tablets by mouth every 4 (four) hours as needed for pain. 05/11/13   Trixie Dredge, PA-C  oxyCODONE-acetaminophen (PERCOCET/ROXICET) 5-325 MG per tablet Take 1 tablet by mouth every 6 (six) hours as needed for severe pain. 06/08/14   Arman Filter, NP  promethazine (PHENERGAN) 25 MG tablet Take 1 tablet (25 mg total) by mouth every 6 (six) hours as needed for nausea. 05/11/13   Trixie Dredge, PA-C   BP 111/70  Pulse 69  Temp(Src) 98 F (36.7 C) (Oral)  Resp 16  SpO2 100%  LMP 05/28/2014 Physical Exam  Nursing note and vitals reviewed. Constitutional: She is oriented to person, place, and time. She appears well-developed and well-nourished.  HENT:  Head: Normocephalic.  Eyes: Pupils are equal, round, and reactive to light.  Neck: Normal range of motion.  Cardiovascular: Normal rate and regular rhythm.   Pulmonary/Chest: Effort normal.  Abdominal: Soft. Bowel sounds are normal. She  exhibits no distension. There is tenderness.  Musculoskeletal: Normal range of motion.  Neurological: She is alert and oriented to person, place, and time.  Skin: No rash noted.  Psychiatric: She has a normal mood and affect.    ED Course  Procedures (including critical care time) Labs Review Labs Reviewed  COMPREHENSIVE METABOLIC PANEL - Abnormal; Notable for the following:    Glucose, Bld 111 (*)    Anion gap 19 (*)    All other components within normal limits  URINALYSIS, ROUTINE W REFLEX MICROSCOPIC - Abnormal; Notable for the following:    APPearance CLOUDY  (*)    Hgb urine dipstick MODERATE (*)    Ketones, ur 15 (*)    Leukocytes, UA MODERATE (*)    All other components within normal limits  URINE MICROSCOPIC-ADD ON - Abnormal; Notable for the following:    Crystals CA OXALATE CRYSTALS (*)    All other components within normal limits  CBC WITH DIFFERENTIAL  PREGNANCY, URINE    Imaging Review No results found.   EKG Interpretation None      MDM  PAtien tis requesting NO CT Scan as insurance has lapsed.  Understands she can return at any time she developes new or worsening symptoms  She clinically has renal stone, Hx of same with no difficulty passing previous  Final diagnoses:  Kidney stone         Arman FilterGail K Chantrell Apsey, NP 06/08/14 [phone]

## 2014-09-21 ENCOUNTER — Other Ambulatory Visit: Payer: Self-pay | Admitting: Gastroenterology

## 2015-08-02 ENCOUNTER — Encounter: Payer: Self-pay | Admitting: Neurology

## 2015-08-02 ENCOUNTER — Ambulatory Visit (INDEPENDENT_AMBULATORY_CARE_PROVIDER_SITE_OTHER): Payer: Commercial Managed Care - HMO | Admitting: Neurology

## 2015-08-02 VITALS — BP 114/74 | HR 91 | Ht 60.0 in | Wt 182.0 lb

## 2015-08-02 DIAGNOSIS — G43009 Migraine without aura, not intractable, without status migrainosus: Secondary | ICD-10-CM

## 2015-08-02 DIAGNOSIS — H538 Other visual disturbances: Secondary | ICD-10-CM

## 2015-08-02 MED ORDER — RIZATRIPTAN BENZOATE 5 MG PO TABS: 5.0000 mg | ORAL_TABLET | ORAL | Status: DC | PRN

## 2015-08-02 MED ORDER — NORTRIPTYLINE HCL 10 MG PO CAPS: ORAL_CAPSULE | ORAL | Status: DC

## 2015-08-02 NOTE — Progress Notes (Signed)
PATIENT: Sherry Johnson DOB: 25-Jul-1991  Chief Complaint  Patient presents with  . Blurred Vision    She has noticed a worsening of blurred vision over the last two years.  She recently had an abnormal eye exam.  Reports getting headached nearly everday.  She typically uses ibuprofen  or excedrin migraine which tends to ease her pain.  She was evaluated for migraines previously and tried topiramate.  She stopped the medication and never followed up with her doctor.     HISTORICAL  Sherry Johnson is a 24 years old female, seen in refer by her optometrist My Mardene Sayer, OD for evaluation of headaches  I have reviewed most recent visit note, July 30 2015, bilateral visual acuity 20/20, blurry margins at bilateral funduscopy examinations.  Since 2014, she noticed mild blurry vision especially driving at nighttime, she also complains of daily headaches, occipital vortex region, mild pressure headaches, getting worse over the past 2 years, she was treated by Topamax in the past, which did help her headaches, but she had a history of kidney stone, is no longer candidate for Topamax,  Besides the daily pressure headaches, couple times a week, she would get more severe pounding headache with associated light noise sensitivity, movement make it worse, Excedrin Migraine helps, but she has return of headaches half to 1 day later, she has never tried triptan treatments in the past  Trigger for her headaches are weather change, exertion, sleep deprivation,,  She also complains of recent weight gain, loud snoring, daytime sleepiness, frequent awakening at nighttime  REVIEW OF SYSTEMS: Full 14 system review of systems performed and notable only for blurred vision, ringing ears, headaches, insomnia, sleepiness  ALLERGIES: No Known Allergies  HOME MEDICATIONS: Current Outpatient Prescriptions  Medication Sig Dispense Refill  . Fiber TABS Take 2 tablets by mouth every morning.    Marland Kitchen ibuprofen  (ADVIL,MOTRIN) 800 MG tablet Take 800 mg by mouth 3 (three) times daily.  2  . norgestrel-ethinyl estradiol (LO/OVRAL,CRYSELLE) 0.3-30 MG-MCG tablet Take 1 tablet by mouth at bedtime.        PAST MEDICAL HISTORY: Past Medical History  Diagnosis Date  . Chronic headaches   . Gallstones   . Kidney stone 04/2013    PAST SURGICAL HISTORY: Past Surgical History  Procedure Laterality Date  . Cholecystectomy      FAMILY HISTORY: Family History  Problem Relation Age of Onset  . Breast cancer Paternal Aunt   . Prostate cancer Maternal Grandfather   . Diabetes Maternal Grandfather   . Colon cancer Paternal Aunt   . Lung cancer Paternal Aunt   . Lung cancer Maternal Grandmother   . Heart disease Maternal Grandmother   . Diabetes Father   . Diabetes Maternal Aunt   . Heart attack Mother   . Aneurysm Mother   . Stroke Father     SOCIAL HISTORY:  Social History   Social History  . Marital Status: Single    Spouse Name: N/A  . Number of Children: 0  . Years of Education: 12+   Occupational History  . sales associate    Social History Main Topics  . Smoking status: Never Smoker   . Smokeless tobacco: Never Used  . Alcohol Use: 0.0 oz/week    0 Standard drinks or equivalent per week     Comment: On rare occasions  . Drug Use: No  . Sexual Activity: Not on file   Other Topics Concern  . Not on file  Social History Narrative   Lives at home with her husband.   Right-handed.   4-6 cups caffeine daily.     PHYSICAL EXAM   Filed Vitals:   08/02/15 0830  BP: 114/74  Pulse: 91  Height: 5' (1.524 m)  Weight: 182 lb (82.555 kg)    Not recorded      Body mass index is 35.54 kg/(m^2).  PHYSICAL EXAMNIATION:  Gen: NAD, conversant, well nourised, obese, well groomed                     Cardiovascular: Regular rate rhythm, no peripheral edema, warm, nontender. Eyes: Conjunctivae clear without exudates or hemorrhage Neck: Supple, no carotid bruise. Pulmonary:  Clear to auscultation bilaterally   NEUROLOGICAL EXAM:  MENTAL STATUS: Speech:    Speech is normal; fluent and spontaneous with normal comprehension.  Cognition:     Orientation to time, place and person     Normal recent and remote memory     Normal Attention span and concentration     Normal Language, naming, repeating,spontaneous speech     Fund of knowledge   CRANIAL NERVES: CN II: Visual fields are full to confrontation. Fundoscopic exam is normal with sharp discs, I was not able to appreciate venous pulsations. Pupils are round equal and briskly reactive to light. CN III, IV, VI: extraocular movement are normal. No ptosis. CN V: Facial sensation is intact to pinprick in all 3 divisions bilaterally. Corneal responses are intact.  CN VII: Face is symmetric with normal eye closure and smile. CN VIII: Hearing is normal to rubbing fingers CN IX, X: Palate elevates symmetrically. Phonation is normal. CN XI: Head turning and shoulder shrug are intact CN XII: Tongue is midline with normal movements and no atrophy.  MOTOR: There is no pronator drift of out-stretched arms. Muscle bulk and tone are normal. Muscle strength is normal.  REFLEXES: Reflexes are 2+ and symmetric at the biceps, triceps, knees, and ankles. Plantar responses are flexor.  SENSORY: Intact to light touch, pinprick, position sense, and vibration sense are intact in fingers and toes.  COORDINATION: Rapid alternating movements and fine finger movements are intact. There is no dysmetria on finger-to-nose and heel-knee-shin.    GAIT/STANCE: Posture is normal. Gait is steady with normal steps, base, arm swing, and turning. Heel and toe walking are normal. Tandem gait is normal.  Romberg is absent.   DIAGNOSTIC DATA (LABS, IMAGING, TESTING) - I reviewed patient records, labs, notes, testing and imaging myself where available.  ASSESSMENT AND PLAN  Sherry Johnson is a 24 y.o. female  Chronic migraine without aura     Not a candidate for Topamax, because of history of Kidney stone  Nortriptyline 10 mg, titrating to 20 mg every night  Maxalt 5 mg as needed Possible pseudotumor cerebri   I do not see significant papillary edema, I was not able to appreciate venous pulsations either,  MRI of the brain  If she continue complaining worsening blurry vision may consider lumbar puncture, visual acuity now is 20/20  Encouraged her weight loss   Levert Feinstein, M.D. Ph.D.  [facility] [address], [address] [place], [place] [address] Ph: [phone] Fax: [phone]  CC: My Mardene Sayer, OD

## 2015-08-21 ENCOUNTER — Ambulatory Visit (INDEPENDENT_AMBULATORY_CARE_PROVIDER_SITE_OTHER): Payer: Commercial Managed Care - HMO

## 2015-08-21 DIAGNOSIS — G43009 Migraine without aura, not intractable, without status migrainosus: Secondary | ICD-10-CM

## 2015-08-21 DIAGNOSIS — H538 Other visual disturbances: Secondary | ICD-10-CM | POA: Diagnosis not present

## 2015-08-26 ENCOUNTER — Encounter: Payer: Self-pay | Admitting: *Deleted

## 2015-08-26 ENCOUNTER — Telehealth: Payer: Self-pay | Admitting: *Deleted

## 2015-08-26 MED ORDER — SUMATRIPTAN SUCCINATE 100 MG PO TABS: 100.0000 mg | ORAL_TABLET | Freq: Once | ORAL | Status: DC | PRN

## 2015-08-26 NOTE — Addendum Note (Signed)
Addended by: Levert Feinstein on: 08/26/2015 10:06 AM   Modules accepted: Orders

## 2015-08-26 NOTE — Telephone Encounter (Signed)
Spoke to patient - says Maxalt is not helpful for her migraines - she has tried repeated doses and would like to try another rescue medication (she has no allergies).

## 2015-08-26 NOTE — Telephone Encounter (Signed)
Spoke to Sherry Johnson - she is aware of normal results.

## 2015-08-26 NOTE — Telephone Encounter (Signed)
Spoke to Sherry Johnson - she verbalized understanding of the information below and will call back if her headaches continue to be a problem.

## 2015-08-26 NOTE — Telephone Encounter (Signed)
Michele: Please let patient's know, I have called in new prescription Imitrex 100 mg as needed for her headaches,  her optometrist Dr. Conley Rolls referred her to our clinic concern about the possibility of pseudotumor cerebri, she has normal MRI of the brain, if she continue to complains of headaches after trying Imitrex for 1-2 weeks,  she needs to call back to our office, we may consider lumbar puncture,  If her headache is better with Imitrex, may continue follow-up as previously scheduled

## 2015-10-03 ENCOUNTER — Encounter: Payer: Self-pay | Admitting: Neurology

## 2015-10-03 ENCOUNTER — Ambulatory Visit (INDEPENDENT_AMBULATORY_CARE_PROVIDER_SITE_OTHER): Payer: Commercial Managed Care - HMO | Admitting: Neurology

## 2015-10-03 VITALS — BP 120/86 | HR 82 | Ht 60.0 in | Wt 181.0 lb

## 2015-10-03 DIAGNOSIS — G43709 Chronic migraine without aura, not intractable, without status migrainosus: Secondary | ICD-10-CM | POA: Diagnosis not present

## 2015-10-03 DIAGNOSIS — IMO0002 Reserved for concepts with insufficient information to code with codable children: Secondary | ICD-10-CM | POA: Insufficient documentation

## 2015-10-03 DIAGNOSIS — G932 Benign intracranial hypertension: Secondary | ICD-10-CM | POA: Diagnosis not present

## 2015-10-03 NOTE — Progress Notes (Signed)
Levert Feinstein, M.D. Ph.D.  [facility] [address]    PATIENT: Sherry Johnson DOB: [birth year]  Chief Complaint  Patient presents with  . Migraine    She stopped taking nortriptyline at bedtime because she felt it worsened the frequency of her headaches.  She has been eating healthy and exercising regularly which seems to have improved her symptoms.  For her more severe migraines, sumatriptan has been helpful to resolve the pain.     HISTORICAL  Sherry Johnson is a 24 years old female, seen in refer by her optometrist My Mardene Sayer, OD for evaluation of headaches  I have reviewed most recent visit note, July 30 2015, bilateral visual acuity 20/20, blurry margins at bilateral funduscopy examinations.  Since 2014, she noticed mild blurry vision especially driving at nighttime, she also complains of daily headaches, occipital vortex region, mild pressure headaches, getting worse over the past 2 years, she was treated by Topamax in the past, which did help her headaches, but she had a history of kidney stone, is no longer candidate for Topamax,  Besides the daily pressure headaches, couple times a week, she would get more severe pounding headache with associated light noise sensitivity, movement make it worse, Excedrin Migraine helps, but she has return of headaches half to 1 day later, she has never tried triptan treatments in the past  Trigger for her headaches are weather change, exertion, sleep deprivation,,  She also complains of recent weight gain, loud snoring, daytime sleepiness, frequent awakening at nighttime  UPDATE Oct 03 2015: She has tried every night nortriptyline, actually made her headache worse, she has quit taking it after 3 weeks, she is now exercising, eating healthy, her headache has much improved, she had tried Imitrex 100 mg as needed twice, works well for her headaches, does complains mild drowsiness after medications, continue have nighttime blurry  vision, visual distortion, will have new prescriptions soon.   REVIEW OF SYSTEMS: Full 14 system review of systems performed and notable only for blurred vision, ringing ears, headaches, insomnia, sleepiness  ALLERGIES: No Known Allergies  HOME MEDICATIONS: Current Outpatient Prescriptions  Medication Sig Dispense Refill  . Fiber TABS Take 2 tablets by mouth every morning.    Marland Kitchen ibuprofen (ADVIL,MOTRIN) 800 MG tablet Take 800 mg by mouth 3 (three) times daily.  2  . norgestrel-ethinyl estradiol (LO/OVRAL,CRYSELLE) 0.3-30 MG-MCG tablet Take 1 tablet by mouth at bedtime.        PAST MEDICAL HISTORY: Past Medical History  Diagnosis Date  . Chronic headaches   . Gallstones   . Kidney stone 04/2013    PAST SURGICAL HISTORY: Past Surgical History  Procedure Laterality Date  . Cholecystectomy      FAMILY HISTORY: Family History  Problem Relation Age of Onset  . Breast cancer Paternal Aunt   . Prostate cancer Maternal Grandfather   . Diabetes Maternal Grandfather   . Colon cancer Paternal Aunt   . Lung cancer Paternal Aunt   . Lung cancer Maternal Grandmother   . Heart disease Maternal Grandmother   . Diabetes Father   . Diabetes Maternal Aunt   . Heart attack Mother   . Aneurysm Mother   . Stroke Father     SOCIAL HISTORY:  Social History   Social History  . Marital Status: Single    Spouse Name: N/A  . Number of Children: 0  . Years of Education: 12+   Occupational History  . sales associate    Social History Main Topics  .  Smoking status: Never Smoker   . Smokeless tobacco: Never Used  . Alcohol Use: 0.0 oz/week    0 Standard drinks or equivalent per week     Comment: On rare occasions  . Drug Use: No  . Sexual Activity: Not on file   Other Topics Concern  . Not on file   Social History Narrative   Lives at home with her husband.   Right-handed.   4-6 cups caffeine daily.     PHYSICAL EXAM   Filed Vitals:   10/03/15 0835  BP: 120/86  Pulse:  82  Height: 5' (1.524 m)  Weight: 181 lb (82.101 kg)    Not recorded      Body mass index is 35.35 kg/(m^2).  PHYSICAL EXAMNIATION:  Gen: NAD, conversant, well nourised, obese, well groomed                     Cardiovascular: Regular rate rhythm, no peripheral edema, warm, nontender. Eyes: Conjunctivae clear without exudates or hemorrhage Neck: Supple, no carotid bruise. Pulmonary: Clear to auscultation bilaterally   NEUROLOGICAL EXAM:  MENTAL STATUS: Speech:    Speech is normal; fluent and spontaneous with normal comprehension.  Cognition:     Orientation to time, place and person     Normal recent and remote memory     Normal Attention span and concentration     Normal Language, naming, repeating,spontaneous speech     Fund of knowledge   CRANIAL NERVES: CN II: Visual fields are full to confrontation. Fundoscopic exam showed mild blurry edges of bilateral optic disc, I was not able to appreciate venous pulsations. Pupils are round equal and briskly reactive to light. CN III, IV, VI: extraocular movement are normal. No ptosis. CN V: Facial sensation is intact to pinprick in all 3 divisions bilaterally. Corneal responses are intact.  CN VII: Face is symmetric with normal eye closure and smile. CN VIII: Hearing is normal to rubbing fingers CN IX, X: Palate elevates symmetrically. Phonation is normal. CN XI: Head turning and shoulder shrug are intact CN XII: Tongue is midline with normal movements and no atrophy.  MOTOR: There is no pronator drift of out-stretched arms. Muscle bulk and tone are normal. Muscle strength is normal.  REFLEXES: Reflexes are 2+ and symmetric at the biceps, triceps, knees, and ankles. Plantar responses are flexor.  SENSORY: Intact to light touch, pinprick, position sense, and vibration sense are intact in fingers and toes.  COORDINATION: Rapid alternating movements and fine finger movements are intact. There is no dysmetria on finger-to-nose  and heel-knee-shin.    GAIT/STANCE: Posture is normal. Gait is steady with normal steps, base, arm swing, and turning. Heel and toe walking are normal. Tandem gait is normal.  Romberg is absent.   DIAGNOSTIC DATA (LABS, IMAGING, TESTING) - I reviewed patient records, labs, notes, testing and imaging myself where available.  ASSESSMENT AND PLAN  Sherry Johnson is a 24 y.o. female  Chronic migraine without aura   Not a candidate for Topamax, because of history of Kidney stone  Imitrex 100 mg as needed works well for her  Possible pseudotumor cerebri   There was evidence of mild papillary edema, I was not able to appreciate venous pulsations  MRI of the brain was normal  She does complains of nighttime blurry vision, visual distortion  Proceed with fluoroscopy guided lumbar puncture   Levert Feinstein, M.D. Ph.D.  [facility] [address], [address] [place], [place] [address] Ph: [phone]  Fax: [phone]  CC: My Randalyn RheaHong Le, OD   [place] [address] Phone: [phone] Fax:      [phone]

## 2015-10-11 ENCOUNTER — Other Ambulatory Visit: Payer: Self-pay

## 2015-10-11 ENCOUNTER — Ambulatory Visit
Admission: RE | Admit: 2015-10-11 | Discharge: 2015-10-11 | Disposition: A | Discharge status: Home / Self Care | Payer: Commercial Managed Care - HMO | Source: Ambulatory Visit | Attending: Neurology | Admitting: Neurology

## 2015-10-11 ENCOUNTER — Other Ambulatory Visit: Payer: Self-pay | Admitting: Neurology

## 2015-10-11 DIAGNOSIS — G43709 Chronic migraine without aura, not intractable, without status migrainosus: Secondary | ICD-10-CM

## 2015-10-11 DIAGNOSIS — G932 Benign intracranial hypertension: Secondary | ICD-10-CM

## 2015-10-11 DIAGNOSIS — IMO0002 Reserved for concepts with insufficient information to code with codable children: Secondary | ICD-10-CM

## 2015-10-11 LAB — CSF CELL COUNT WITH DIFFERENTIAL
RBC COUNT CSF: 0 uL
Tube #: 4
WBC CSF: 2 uL (ref 0–30)

## 2015-10-11 LAB — GLUCOSE, CSF: GLUCOSE CSF: 62 mg/dL (ref 43–76)

## 2015-10-11 LAB — GRAM STAIN
GRAM STAIN: NONE SEEN
GRAM STAIN: NONE SEEN

## 2015-10-11 LAB — PROTEIN, CSF: Total Protein, CSF: 24 mg/dL (ref 15–45)

## 2015-10-11 NOTE — Discharge Instructions (Signed)

## 2015-10-14 ENCOUNTER — Telehealth: Payer: Self-pay | Admitting: Neurology

## 2015-10-14 LAB — VDRL, CSF: SYPHILIS VDRL QUANT CSF: NONREACTIVE

## 2015-10-14 NOTE — Telephone Encounter (Addendum)
Patient aware of results.  Stated she went home from work today with a headache that did not respond well to Excedrin Migraine.  Instructed her to try her Imitrex and repeat in two hours, if pain is unresolved.  Instructed her to call me back in the morning, if headache fails home medication intervention.  She verbalized understanding.

## 2015-10-14 NOTE — Telephone Encounter (Signed)
Please call patient, lumbar puncture showed (pressure of 28 cm water, consistent with diagnosis of pseudotumor cerebri

## 2015-10-31 ENCOUNTER — Ambulatory Visit (INDEPENDENT_AMBULATORY_CARE_PROVIDER_SITE_OTHER): Payer: Commercial Managed Care - HMO | Admitting: Neurology

## 2015-10-31 ENCOUNTER — Encounter: Payer: Self-pay | Admitting: Neurology

## 2015-10-31 VITALS — BP 112/80 | HR 78 | Ht 60.0 in | Wt 183.0 lb

## 2015-10-31 DIAGNOSIS — G43709 Chronic migraine without aura, not intractable, without status migrainosus: Secondary | ICD-10-CM | POA: Diagnosis not present

## 2015-10-31 DIAGNOSIS — G932 Benign intracranial hypertension: Secondary | ICD-10-CM | POA: Diagnosis not present

## 2015-10-31 DIAGNOSIS — IMO0002 Reserved for concepts with insufficient information to code with codable children: Secondary | ICD-10-CM

## 2015-10-31 MED ORDER — RIZATRIPTAN BENZOATE 5 MG PO TBDP: 5.0000 mg | ORAL_TABLET | ORAL | Status: DC | PRN

## 2015-10-31 MED ORDER — PROPRANOLOL HCL ER 80 MG PO CP24: 80.0000 mg | ORAL_CAPSULE | Freq: Every day | ORAL | Status: DC

## 2015-10-31 NOTE — Progress Notes (Signed)
Chief Complaint  Patient presents with  . Pseudotumor Cerebri    She is here with her mother, Sherry Johnson.  She would like to discuss her MRI and LP results.  She had a constant headache for one week after her LP.  She tried Imitrex twice but had a tingling sensation in her head and vomited with both doses, although it was helpful for the pain.  She decided to just use Excedrin Migraine.   Levert FeinsteinYijun Denisa Enterline, M.D. Ph.D.  [facility] [address]    PATIENT: Sherry Johnson DOB: 10-Aug-1991  Chief Complaint  Patient presents with  . Pseudotumor Cerebri    She is here with her mother, Sherry Johnson.  She would like to discuss her MRI and LP results.  She had a constant headache for one week after her LP.  She tried Imitrex twice but had a tingling sensation in her head and vomited with both doses, although it was helpful for the pain.  She decided to just use Excedrin Migraine.     HISTORICAL  Sherry Johnson is a 24 years old female, seen in refer by her optometrist My Mardene SayerHong Le, OD for evaluation of headaches  I have reviewed most recent visit note, July 30 2015, bilateral visual acuity 20/20, blurry margins at bilateral funduscopy examinations.  Since 2014, she noticed mild blurry vision especially driving at nighttime, she also complains of daily headaches, occipital vortex region, mild pressure headaches, getting worse over the past 2 years, she was treated by Topamax in the past, which did help her headaches, but she had a history of kidney stone, is no longer candidate for Topamax,  Besides the daily pressure headaches, couple times a week, she would get more severe pounding headache with associated light noise sensitivity, movement make it worse, Excedrin Migraine helps, but she has return of headaches half to 1 day later, she has never tried triptan treatments in the past  Trigger for her headaches are weather change, exertion, sleep deprivation,,  She also complains of recent weight  gain, loud snoring, daytime sleepiness, frequent awakening at nighttime  UPDATE Oct 03 2015: She has tried every night nortriptyline, actually made her headache worse, she has quit taking it after 3 weeks, she is now exercising, eating healthy, her headache has much improved, she had tried Imitrex 100 mg as needed twice, works well for her headaches, does complains mild drowsiness after medications, continue have nighttime blurry vision, visual distortion, will have new prescriptions soon.   UPDATE Oct 31 2015: She had lumbar puncture in October 11 2015, Open Pressure was 28 cm H2O, with normal csf. lumbar puncture did help her headache temporarily, but she developed post LP headache, positional related last for 1 week, she has tried Imitrex, which helps her headache, but cause nausea,. Weird sensation in her head,  REVIEW OF SYSTEMS: Full 14 system review of systems performed and notable only for blurred vision, ringing ears, headaches, insomnia, sleepiness  ALLERGIES: No Known Allergies  HOME MEDICATIONS: Current Outpatient Prescriptions  Medication Sig Dispense Refill  . Fiber TABS Take 2 tablets by mouth every morning.    Sherry Johnson. ibuprofen (ADVIL,MOTRIN) 800 MG tablet Take 800 mg by mouth 3 (three) times daily.  2  . norgestrel-ethinyl estradiol (LO/OVRAL,CRYSELLE) 0.3-30 MG-MCG tablet Take 1 tablet by mouth at bedtime.        PAST MEDICAL HISTORY: Past Medical History  Diagnosis Date  . Chronic headaches   . Gallstones   . Kidney stone 04/2013  PAST SURGICAL HISTORY: Past Surgical History  Procedure Laterality Date  . Cholecystectomy      FAMILY HISTORY: Family History  Problem Relation Age of Onset  . Breast cancer Paternal Aunt   . Prostate cancer Maternal Grandfather   . Diabetes Maternal Grandfather   . Colon cancer Paternal Aunt   . Lung cancer Paternal Aunt   . Lung cancer Maternal Grandmother   . Heart disease Maternal Grandmother   . Diabetes Father   . Diabetes  Maternal Aunt   . Heart attack Mother   . Aneurysm Mother   . Stroke Father     SOCIAL HISTORY:  Social History   Social History  . Marital Status: Single    Spouse Name: N/A  . Number of Children: 0  . Years of Education: 12+   Occupational History  . sales associate    Social History Main Topics  . Smoking status: Never Smoker   . Smokeless tobacco: Never Used  . Alcohol Use: 0.0 oz/week    0 Standard drinks or equivalent per week     Comment: On rare occasions  . Drug Use: No  . Sexual Activity: Not on file   Other Topics Concern  . Not on file   Social History Narrative   Lives at home with her husband.   Right-handed.   4-6 cups caffeine daily.     PHYSICAL EXAM   Filed Vitals:   10/31/15 0804  BP: 112/80  Pulse: 78  Height: 5' (1.524 m)  Weight: 183 lb (83.008 kg)    Not recorded      Body mass index is 35.74 kg/(m^2).  PHYSICAL EXAMNIATION:  Gen: NAD, conversant, well nourised, obese, well groomed                     Cardiovascular: Regular rate rhythm, no peripheral edema, warm, nontender. Eyes: Conjunctivae clear without exudates or hemorrhage Neck: Supple, no carotid bruise. Pulmonary: Clear to auscultation bilaterally   NEUROLOGICAL EXAM:  MENTAL STATUS: Speech:    Speech is normal; fluent and spontaneous with normal comprehension.  Cognition:     Orientation to time, place and person     Normal recent and remote memory     Normal Attention span and concentration     Normal Language, naming, repeating,spontaneous speech     Fund of knowledge   CRANIAL NERVES: CN II: Visual fields are full to confrontation. Fundoscopic exam showed mild blurry edges of bilateral optic disc, I was not able to appreciate venous pulsations. Pupils are round equal and briskly reactive to light. CN III, IV, VI: extraocular movement are normal. No ptosis. CN V: Facial sensation is intact to pinprick in all 3 divisions bilaterally. Corneal responses are  intact.  CN VII: Face is symmetric with normal eye closure and smile. CN VIII: Hearing is normal to rubbing fingers CN IX, X: Palate elevates symmetrically. Phonation is normal. CN XI: Head turning and shoulder shrug are intact CN XII: Tongue is midline with normal movements and no atrophy.  MOTOR: There is no pronator drift of out-stretched arms. Muscle bulk and tone are normal. Muscle strength is normal.  REFLEXES: Reflexes are 2+ and symmetric at the biceps, triceps, knees, and ankles. Plantar responses are flexor.  SENSORY: Intact to light touch, pinprick, position sense, and vibration sense are intact in fingers and toes.  COORDINATION: Rapid alternating movements and fine finger movements are intact. There is no dysmetria on finger-to-nose and heel-knee-shin.  GAIT/STANCE: Posture is normal. Gait is steady with normal steps, base, arm swing, and turning. Heel and toe walking are normal. Tandem gait is normal.  Romberg is absent.   DIAGNOSTIC DATA (LABS, IMAGING, TESTING) - I reviewed patient records, labs, notes, testing and imaging myself where available.  ASSESSMENT AND PLAN  Tali Chalupa is a 24 y.o. female  Chronic migraine without aura   Magnesium oxide 400 mg, riboflavin 100 mg twice a day as headache prevention  Imitrex causes nausea, weird sensation in her head  We will try Maxalt 5 mg as needed  Possible pseudotumor cerebri   Bilateral funduscopy examination showed sharp edge bilaterally, but I was not able to appreciate venous pulsations  Open Pressure was 28 cm water in October 11 2015  Not a candidate for Topamax and Diamox due to history of kidney stone  I have advised her weight loss, exercise regularly  Levert Feinstein, M.D. Ph.D.  [facility] [address] [place], [place] [address] Ph: [phone] Fax: [phone]  CC: My Randalyn Rhea   [place], [place] [address] Phone: [phone] Fax:      [phone]

## 2015-10-31 NOTE — Patient Instructions (Signed)
Magnesium oxide 400mg , Riboflavin 100mg  twice a day

## 2015-11-05 ENCOUNTER — Telehealth: Payer: Self-pay

## 2015-11-05 NOTE — Telephone Encounter (Signed)
Spoke to patient. She agreed to see NP-Megan Millikan instead of Dr. Terrace ArabiaYan on 12/17/15 due to schedule adjustments.

## 2015-12-17 ENCOUNTER — Ambulatory Visit: Payer: Commercial Managed Care - HMO | Admitting: Neurology

## 2015-12-17 ENCOUNTER — Encounter: Payer: Self-pay | Admitting: Adult Health

## 2015-12-17 ENCOUNTER — Ambulatory Visit (INDEPENDENT_AMBULATORY_CARE_PROVIDER_SITE_OTHER): Payer: Commercial Managed Care - HMO | Admitting: Adult Health

## 2015-12-17 VITALS — BP 126/83 | HR 85 | Ht 60.0 in | Wt 180.0 lb

## 2015-12-17 DIAGNOSIS — G932 Benign intracranial hypertension: Secondary | ICD-10-CM

## 2015-12-17 DIAGNOSIS — G43009 Migraine without aura, not intractable, without status migrainosus: Secondary | ICD-10-CM | POA: Diagnosis not present

## 2015-12-17 NOTE — Patient Instructions (Signed)
If your symptoms worsen or you develop new symptoms please let us know.   

## 2015-12-17 NOTE — Progress Notes (Signed)
PATIENT: Sherry Johnson DOB: Sep 08, 1991  REASON FOR VISIT: follow up- pseudotumor cerebri HISTORY FROM: patient  HISTORY OF PRESENT ILLNESS: [name] is a 25 year old female with a history of pseudotumor cerebri. She returns today for follow-up. She states that she has been doing well. She is actually not taking any medication currently. She states that she increased her water intake to drinking 5 bottles of water a day and her headaches have resolved. She states that she's not had a headache in the last month. Patient states that she is trying to diet and exercise. She has found it hard to lose weight however she has lost 3 pounds. She denies any changes with her vision currently. She has not been seen by her optometrist since her initial visit and referral to our office. She denies any new neurological symptoms. She returns today for an evaluation.  HISTORY 10/31/15: Sherry Johnson is a 25 years old female, seen in refer by her optometrist My Mardene Sayer, OD for evaluation of headaches  I have reviewed most recent visit note, July 30 2015, bilateral visual acuity 20/20, blurry margins at bilateral funduscopy examinations.  Since 2014, she noticed mild blurry vision especially driving at nighttime, she also complains of daily headaches, occipital vortex region, mild pressure headaches, getting worse over the past 2 years, she was treated by Topamax in the past, which did help her headaches, but she had a history of kidney stone, is no longer candidate for Topamax,  Besides the daily pressure headaches, couple times a week, she would get more severe pounding headache with associated light noise sensitivity, movement make it worse, Excedrin Migraine helps, but she has return of headaches half to 1 day later, she has never tried triptan treatments in the past  Trigger for her headaches are weather change, exertion, sleep deprivation,,  She also complains of recent weight gain, loud snoring,  daytime sleepiness, frequent awakening at nighttime  UPDATE Oct 03 2015: She has tried every night nortriptyline, actually made her headache worse, she has quit taking it after 3 weeks, she is now exercising, eating healthy, her headache has much improved, she had tried Imitrex 100 mg as needed twice, works well for her headaches, does complains mild drowsiness after medications, continue have nighttime blurry vision, visual distortion, will have new prescriptions soon.   UPDATE Oct 31 2015: She had lumbar puncture in October 11 2015, Open Pressure was 28 cm H2O, with normal csf. lumbar puncture did help her headache temporarily, but she developed post LP headache, positional related last for 1 week, she has tried Imitrex, which helps her headache, but cause nausea,. Weird sensation in her head,  REVIEW OF SYSTEMS: Out of a complete 14 system review of symptoms, the patient complains only of the following symptoms, and all other reviewed systems are negative.  See history of present illness  ALLERGIES: No Known Allergies  HOME MEDICATIONS: Outpatient Prescriptions Prior to Visit  Medication Sig Dispense Refill  . ibuprofen (ADVIL,MOTRIN) 800 MG tablet Take 800 mg by mouth 3 (three) times daily.  2  . propranolol ER (INDERAL LA) 80 MG 24 hr capsule Take 1 capsule (80 mg total) by mouth daily. (Patient not taking: Reported on 12/17/2015) 30 capsule 6  . rizatriptan (MAXALT-MLT) 5 MG disintegrating tablet Take 1 tablet (5 mg total) by mouth as needed. May repeat in 2 hours if needed (Patient not taking: Reported on 12/17/2015) 15 tablet 6  . Fiber TABS Take 2 tablets by mouth every morning.  No facility-administered medications prior to visit.    PAST MEDICAL HISTORY: Past Medical History  Diagnosis Date  . Chronic headaches   . Gallstones   . Kidney stone 04/2013    PAST SURGICAL HISTORY: Past Surgical History  Procedure Laterality Date  . Cholecystectomy      FAMILY  HISTORY: Family History  Problem Relation Age of Onset  . Breast cancer Paternal Aunt   . Prostate cancer Maternal Grandfather   . Diabetes Maternal Grandfather   . Colon cancer Paternal Aunt   . Lung cancer Paternal Aunt   . Lung cancer Maternal Grandmother   . Heart disease Maternal Grandmother   . Diabetes Father   . Diabetes Maternal Aunt   . Heart attack Mother   . Aneurysm Mother   . Stroke Father     SOCIAL HISTORY: Social History   Social History  . Marital Status: Single    Spouse Name: N/A  . Number of Children: 0  . Years of Education: 12+   Occupational History  . sales associate    Social History Main Topics  . Smoking status: Never Smoker   . Smokeless tobacco: Never Used  . Alcohol Use: 0.0 oz/week    0 Standard drinks or equivalent per week     Comment: On rare occasions  . Drug Use: No  . Sexual Activity: Not on file   Other Topics Concern  . Not on file   Social History Narrative   Lives at home with her husband.   Right-handed.   4-6 cups caffeine daily.      PHYSICAL EXAM  Filed Vitals:   12/17/15 0817  BP: 126/83  Pulse: 85  Height: 5' (1.524 m)  Weight: 180 lb (81.647 kg)   Body mass index is 35.15 kg/(m^2).  Generalized: Well developed, in no acute distress   Neurological examination  Mentation: Alert oriented to time, place, history taking. Follows all commands speech and language fluent Cranial nerve II-XII: Funduscopic exam -was not able to visualize venous pulsation, there might be some slight blurring of the optic disc bilaterally. Pupils were equal round reactive to light. Extraocular movements were full, visual field were full on confrontational test. Facial sensation and strength were normal. Uvula tongue midline. Head turning and shoulder shrug  were normal and symmetric. Motor: The motor testing reveals 5 over 5 strength of all 4 extremities. Good symmetric motor tone is noted throughout.  Sensory: Sensory testing is  intact to soft touch on all 4 extremities. No evidence of extinction is noted.  Coordination: Cerebellar testing reveals good finger-nose-finger and heel-to-shin bilaterally.  Gait and station: Gait is normal. Tandem gait is normal. Romberg is negative. No drift is seen.  Reflexes: Deep tendon reflexes are symmetric and normal bilaterally.   DIAGNOSTIC DATA (LABS, IMAGING, TESTING) - I reviewed patient records, labs, notes, testing and imaging myself where available.  Lab Results  Component Value Date   WBC 8.9 06/07/2014   HGB 14.1 06/07/2014   HCT 42.2 06/07/2014   MCV 91.1 06/07/2014   PLT 266 06/07/2014      Component Value Date/Time   NA 143 06/07/2014 2201   K 3.7 06/07/2014 2201   CL 101 06/07/2014 2201   CO2 23 06/07/2014 2201   GLUCOSE 111* 06/07/2014 2201   BUN 8 06/07/2014 2201   CREATININE 0.72 06/07/2014 2201   CALCIUM 9.7 06/07/2014 2201   PROT 7.8 06/07/2014 2201   ALBUMIN 4.4 06/07/2014 2201   AST 19 06/07/2014 2201  ALT 15 06/07/2014 2201   ALKPHOS 69 06/07/2014 2201   BILITOT 0.4 06/07/2014 2201   GFRNONAA >90 06/07/2014 2201   GFRAA >90 06/07/2014 2201      ASSESSMENT AND PLAN 25 y.o. year old female  has a past medical history of Chronic headaches; Gallstones; and Kidney stone (04/2013). here with:  1. Pseudotumor cerebri 2. Migraine headaches  The patient is currently not on any medication. Her headaches have essentially resolved for the last month after increasing her water intake. Patient is encouraged to continue this and if her headaches return she should let us know. Patient is also encouraged to follow-up with her optometrist. She will follow up with our office in 6 months or sooner if needed.     Butch Penny, MSN, NP-C 12/17/2015, 8:51 AM [facility] [address] [place], [place] [address] [phone]

## 2015-12-17 NOTE — Progress Notes (Signed)
I have reviewed and agreed above plan. 

## 2016-06-22 ENCOUNTER — Encounter: Payer: Self-pay | Admitting: Adult Health

## 2016-06-22 ENCOUNTER — Ambulatory Visit (INDEPENDENT_AMBULATORY_CARE_PROVIDER_SITE_OTHER): Payer: Commercial Managed Care - HMO | Admitting: Adult Health

## 2016-06-22 VITALS — BP 112/79 | HR 69 | Resp 16 | Ht 60.0 in | Wt 180.0 lb

## 2016-06-22 DIAGNOSIS — G932 Benign intracranial hypertension: Secondary | ICD-10-CM

## 2016-06-22 NOTE — Patient Instructions (Signed)
Continue to monitor headaches If frequency increases let us know Follow-up with opthalmology Continue exercise and dieting If your symptoms worsen or you develop new symptoms please let us know.

## 2016-06-22 NOTE — Progress Notes (Addendum)
PATIENT: Sherry Johnson DOB: 22-Aug-1991  REASON FOR VISIT: follow up- pseudotumor cerebri HISTORY FROM: patient  HISTORY OF PRESENT ILLNESS: HISTORY   HISTORY 10/31/15: Sherry Johnson is a 25 years old female, seen in refer by her optometrist My Mardene Sayer, OD for evaluation of headaches  I have reviewed most recent visit note, July 30 2015, bilateral visual acuity 20/20, blurry margins at bilateral funduscopy examinations.  Since 2014, she noticed mild blurry vision especially driving at nighttime, she also complains of daily headaches, occipital vortex region, mild pressure headaches, getting worse over the past 2 years, she was treated by Topamax in the past, which did help her headaches, but she had a history of kidney stone, is no longer candidate for Topamax,  Besides the daily pressure headaches, couple times a week, she would get more severe pounding headache with associated light noise sensitivity, movement make it worse, Excedrin Migraine helps, but she has return of headaches half to 1 day later, she has never tried triptan treatments in the past  Trigger for her headaches are weather change, exertion, sleep deprivation,,  She also complains of recent weight gain, loud snoring, daytime sleepiness, frequent awakening at nighttime  UPDATE Oct 03 2015: She has tried every night nortriptyline, actually made her headache worse, she has quit taking it after 3 weeks, she is now exercising, eating healthy, her headache has much improved, she had tried Imitrex 100 mg as needed twice, works well for her headaches, does complains mild drowsiness after medications, continue have nighttime blurry vision, visual distortion, will have new prescriptions soon.   UPDATE Oct 31 2015: She had lumbar puncture in October 11 2015, Open Pressure was 28 cm H2O, with normal csf. lumbar puncture did help her headache temporarily, but she developed post LP headache, positional related last for 1  week, she has tried Imitrex, which helps her headache, but cause nausea,. Weird sensation in her head,  UPDATE 12/17/15: Sherry Johnson is a 25 year old female with a history of pseudotumor cerebri. She returns today for follow-up. She states that she has been doing well. She is actually not taking any medication currently. She states that she increased her water intake to drinking 5 bottles of water a day and her headaches have resolved. She states that she's not had a headache in the last month. Patient states that she is trying to diet and exercise. She has found it hard to lose weight however she has lost 3 pounds. She denies any changes with her vision currently. She has not been seen by her optometrist since her initial visit and referral to our office. She denies any new neurological symptoms. She returns today for an evaluation  Today 06/22/16:   Sherry Johnson is a 25 year old female with a history of pseudotumor cerebri. She returns today for follow-up. She is currently not on any preventative medication. She states her headaches are well controlled. She has approximately one headache a week. Her headache is typically located across the forehead. Denies phonophobia but does have photophobia. No nausea or vomiting. She states she typically can take Excedrin Migraine and her headache resolves fairly quickly. She has been exercising every Monday Wednesday and Friday. She is also changed her eating habits. She states overall she does feel better. She has an appointment with her ophthalmologist in September. She returns today for an evaluation.   REVIEW OF SYSTEMS: Out of a complete 14 system review of symptoms, the patient complains only of the following symptoms, and all other  reviewed systems are negative. See history of present illness  ALLERGIES: No Known Allergies  HOME MEDICATIONS: Outpatient Medications Prior to Visit  Medication Sig Dispense Refill  . ibuprofen (ADVIL,MOTRIN) 800 MG tablet  Take 800 mg by mouth 3 (three) times daily.  2  . propranolol ER (INDERAL LA) 80 MG 24 hr capsule Take 1 capsule (80 mg total) by mouth daily. (Patient not taking: Reported on 12/17/2015) 30 capsule 6  . rizatriptan (MAXALT-MLT) 5 MG disintegrating tablet Take 1 tablet (5 mg total) by mouth as needed. May repeat in 2 hours if needed (Patient not taking: Reported on 12/17/2015) 15 tablet 6   No facility-administered medications prior to visit.     PAST MEDICAL HISTORY: Past Medical History:  Diagnosis Date  . Chronic headaches   . Gallstones   . Kidney stone 04/2013    PAST SURGICAL HISTORY: Past Surgical History:  Procedure Laterality Date  . CHOLECYSTECTOMY      FAMILY HISTORY: Family History  Problem Relation Age of Onset  . Diabetes Father   . Stroke Father   . Heart attack Mother   . Aneurysm Mother   . Breast cancer Paternal Aunt   . Prostate cancer Maternal Grandfather   . Diabetes Maternal Grandfather   . Colon cancer Paternal Aunt   . Lung cancer Paternal Aunt   . Lung cancer Maternal Grandmother   . Heart disease Maternal Grandmother   . Diabetes Maternal Aunt     SOCIAL HISTORY: Social History   Social History  . Marital status: Married    Spouse name: N/A  . Number of children: 0  . Years of education: 12+   Occupational History  . sales associate [employer]   Social History Main Topics  . Smoking status: Never Smoker  . Smokeless tobacco: Never Used  . Alcohol use 0.0 oz/week     Comment: On rare occasions  . Drug use: No  . Sexual activity: Not on file   Other Topics Concern  . Not on file   Social History Narrative   Lives at home with her husband.   Right-handed.   4-6 cups caffeine daily.      PHYSICAL EXAM  Vitals:   06/22/16 0722  BP: 112/79  Pulse: 69  Resp: 16  Weight: 180 lb (81.6 kg)  Height: 5' (1.524 m)   Body mass index is 35.15 kg/m.  Generalized: Well developed, in no acute distress   Neurological examination    Mentation: Alert oriented to time, place, history taking. Follows all commands speech and language fluent Cranial nerve II-XII:Funduscopic exam hard to visualize optic disks. Pupils were equal round reactive to light. Extraocular movements were full, visual field were full on confrontational test. Facial sensation and strength were normal. Uvula tongue midline. Head turning and shoulder shrug  were normal and symmetric. Motor: The motor testing reveals 5 over 5 strength of all 4 extremities. Good symmetric motor tone is noted throughout.  Sensory: Sensory testing is intact to soft touch on all 4 extremities. No evidence of extinction is noted.  Coordination: Cerebellar testing reveals good finger-nose-finger and heel-to-shin bilaterally.  Gait and station: Gait is normal.   Reflexes: Deep tendon reflexes are symmetric and normal bilaterally.   DIAGNOSTIC DATA (LABS, IMAGING, TESTING) - I reviewed patient records, labs, notes, testing and imaging myself where available.   ASSESSMENT AND PLAN 25 y.o. year old female  has a past medical history of Chronic headaches; Gallstones; and Kidney stone (04/2013). here with:  1. Pseudotumor cerebri  Overall the patient is doing well. She does not wish to be on any preventative medication at this time. She is advised that if her headache frequency increases she should let us know. She should continue dieting and exercising. Keep follow-up appointment with her ophthalmologist. Will follow-up in 6 months with Dr. Terrace Arabia or sooner if needed.     Butch Penny, MSN, NP-C 06/22/2016, 7:42 AM [facility] [address] [place], [place] [address] [phone]  Addendum, I reviewed her optometrist Dr. Irwin Brakeman evaluation on July 25 2016, slight blurry disc margin OS more than OD, with a cup-to-disc ratio of 0.2/0.2 OD and 0.2/0.2 OS,

## 2016-06-22 NOTE — Progress Notes (Signed)
I have read the note, and I agree with the clinical assessment and plan.  Meagen Limones KEITH   

## 2016-12-29 ENCOUNTER — Ambulatory Visit: Payer: Commercial Managed Care - HMO | Admitting: Neurology

## 2016-12-29 LAB — OB RESULTS CONSOLE HEPATITIS B SURFACE ANTIGEN: HEP B S AG: NEGATIVE

## 2016-12-29 LAB — OB RESULTS CONSOLE GC/CHLAMYDIA
CHLAMYDIA, DNA PROBE: NEGATIVE
Gonorrhea: NEGATIVE

## 2016-12-29 LAB — OB RESULTS CONSOLE HIV ANTIBODY (ROUTINE TESTING): HIV: NONREACTIVE

## 2016-12-29 LAB — OB RESULTS CONSOLE ANTIBODY SCREEN: Antibody Screen: NEGATIVE

## 2016-12-29 LAB — OB RESULTS CONSOLE RPR: RPR: NONREACTIVE

## 2016-12-29 LAB — OB RESULTS CONSOLE ABO/RH: RH TYPE: POSITIVE

## 2016-12-29 LAB — OB RESULTS CONSOLE RUBELLA ANTIBODY, IGM: RUBELLA: IMMUNE

## 2017-01-04 ENCOUNTER — Other Ambulatory Visit: Payer: Self-pay

## 2017-02-18 ENCOUNTER — Ambulatory Visit (INDEPENDENT_AMBULATORY_CARE_PROVIDER_SITE_OTHER): Payer: Commercial Managed Care - HMO | Admitting: Neurology

## 2017-02-18 ENCOUNTER — Other Ambulatory Visit (HOSPITAL_COMMUNITY): Payer: Self-pay | Admitting: Obstetrics and Gynecology

## 2017-02-18 ENCOUNTER — Encounter: Payer: Self-pay | Admitting: Neurology

## 2017-02-18 VITALS — BP 102/66 | HR 86 | Ht 60.0 in | Wt 184.0 lb

## 2017-02-18 DIAGNOSIS — G43709 Chronic migraine without aura, not intractable, without status migrainosus: Secondary | ICD-10-CM

## 2017-02-18 DIAGNOSIS — Z3A21 21 weeks gestation of pregnancy: Secondary | ICD-10-CM

## 2017-02-18 DIAGNOSIS — O4102X0 Oligohydramnios, second trimester, not applicable or unspecified: Secondary | ICD-10-CM

## 2017-02-18 DIAGNOSIS — IMO0002 Reserved for concepts with insufficient information to code with codable children: Secondary | ICD-10-CM

## 2017-02-18 DIAGNOSIS — G932 Benign intracranial hypertension: Secondary | ICD-10-CM

## 2017-02-18 NOTE — Progress Notes (Signed)
PATIENT: Sherry Johnson DOB: 03-15-1991  REASON FOR VISIT: follow up- pseudotumor cerebri HISTORY FROM: patient  HISTORY OF PRESENT ILLNESS: HISTORY   HISTORY 10/31/15: Sherry Johnson is a 26 years old female, seen in refer by her optometrist Sherry Johnson, OD for evaluation of headaches  I have reviewed most recent visit note, July 30 2015, bilateral visual acuity 20/20, blurry margins at bilateral funduscopy examinations.  Since 2014, she noticed mild blurry vision especially driving at nighttime, she also complains of daily headaches, occipital vortex region, mild pressure headaches, getting worse over the past 2 years, she was treated by Topamax in the past, which did help her headaches, but she had a history of kidney stone, is no longer candidate for Topamax,  Besides the daily pressure headaches, couple times a week, she would get more severe pounding headache with associated light noise sensitivity, movement make it worse, Excedrin Migraine helps, but she has return of headaches half to 1 day later, she has never tried triptan treatments in the past  Trigger for her headaches are weather change, exertion, sleep deprivation,,  She also complains of recent weight gain, loud snoring, daytime sleepiness, frequent awakening at nighttime  UPDATE Oct 03 2015: She has tried every night nortriptyline, actually made her headache worse, she has quit taking it after 3 weeks, she is now exercising, eating healthy, her headache has much improved, she had tried Imitrex 100 mg as needed twice, works well for her headaches, does complains mild drowsiness after medications, continue have nighttime blurry vision, visual distortion, will have new prescriptions soon.   UPDATE Oct 31 2015: She had lumbar puncture in October 11 2015, Open Pressure was 28 cm H2O, with normal csf. lumbar puncture did help her headache temporarily, but she developed post LP headache, positional related last for 1  week, she has tried Imitrex, which helps her headache, but cause nausea,. Weird sensation in her head,  UPDATE 12/17/15: Sherry Johnson is a 26 year old female with a history of pseudotumor cerebri. She returns today for follow-up. She states that she has been doing well. She is actually not taking any medication currently. She states that she increased her water intake to drinking 5 bottles of water a day and her headaches have resolved. She states that she's not had a headache in the last month. Patient states that she is trying to diet and exercise. She has found it hard to lose weight however she has lost 3 pounds. She denies any changes with her vision currently. She has not been seen by her optometrist since her initial visit and referral to our office. She denies any new neurological symptoms. She returns today for an evaluation  UPDATE February 18 2017: Per patient, she was seen by her optometrist in September 2017, got a pair of new glasses, there was some improvement at funduscopy examinations, she is currently [redacted] weeks pregnant, her headache is under where control.  her optometrist Dr. Irwin Brakeman evaluation on July 25 2016, slight blurry disc margin OS more than OD, with a cup-to-disc ratio of 0.2/0.2 OD and 0.2/0.2 OS,  REVIEW OF SYSTEMS: Out of a complete 14 system review of symptoms, the patient complains only of the following symptoms, and all other reviewed systems are negative. See history of present illness  ALLERGIES: No Known Allergies  HOME MEDICATIONS: Outpatient Medications Prior to Visit  Medication Sig Dispense Refill  . ibuprofen (ADVIL,MOTRIN) 800 MG tablet Take 800 mg by mouth 3 (three) times daily.  2  .  norethindrone-ethinyl estradiol-iron (ESTROSTEP FE,TILIA FE,TRI-LEGEST FE) 1-20/1-30/1-35 MG-MCG tablet Take 1 tablet by mouth daily.     No facility-administered medications prior to visit.     PAST MEDICAL HISTORY: Past Medical History:  Diagnosis Date  . Chronic  headaches   . Gallstones   . Kidney stone 04/2013    PAST SURGICAL HISTORY: Past Surgical History:  Procedure Laterality Date  . CHOLECYSTECTOMY      FAMILY HISTORY: Family History  Problem Relation Age of Onset  . Diabetes Father   . Stroke Father   . Heart attack Mother   . Aneurysm Mother   . Breast cancer Paternal Aunt   . Prostate cancer Maternal Grandfather   . Diabetes Maternal Grandfather   . Colon cancer Paternal Aunt   . Lung cancer Paternal Aunt   . Lung cancer Maternal Grandmother   . Heart disease Maternal Grandmother   . Diabetes Maternal Aunt     SOCIAL HISTORY: Social History   Social History  . Marital status: Married    Spouse name: N/A  . Number of children: 0  . Years of education: 12+   Occupational History  . sales associate [employer]   Social History Main Topics  . Smoking status: Never Smoker  . Smokeless tobacco: Never Used  . Alcohol use 0.0 oz/week     Comment: On rare occasions  . Drug use: No  . Sexual activity: Not on file   Other Topics Concern  . Not on file   Social History Narrative   Lives at home with her husband.   Right-handed.   4-6 cups caffeine daily.      PHYSICAL EXAM  Vitals:   02/18/17 0733  BP: 102/66  Pulse: 86  Weight: 184 lb (83.5 kg)  Height: 5' (1.524 m)   Body mass index is 35.94 kg/m.  Generalized: Well developed, in no acute distress   Neurological examination  Mentation: Alert oriented to time, place, history taking. Follows all commands speech and language fluent Cranial nerve II-XII:Funduscopic exam hard to visualize optic disks. Pupils were equal round reactive to light. Extraocular movements were full, visual field were full on confrontational test. Facial sensation and strength were normal. Uvula tongue midline. Head turning and shoulder shrug  were normal and symmetric. Motor: The motor testing reveals 5 over 5 strength of all 4 extremities. Good symmetric motor tone is noted  throughout.  Sensory: Sensory testing is intact to soft touch on all 4 extremities. No evidence of extinction is noted.  Coordination: Cerebellar testing reveals good finger-nose-finger and heel-to-shin bilaterally.  Gait and station: Gait is normal.   Reflexes: Deep tendon reflexes are symmetric and normal bilaterally.   DIAGNOSTIC DATA (LABS, IMAGING, TESTING) - I reviewed patient records, labs, notes, testing and imaging myself where available.   ASSESSMENT AND PLAN 26 y.o. year old female   History of Pseudotumor cerebri Headache is under where control, I did not see any papillary edema on today's examination, She is currently [redacted] weeks pregnant Return to clinic for new issues, continue her ophthalmology evaluation  Levert FeinsteinYijun Lorenda Grecco, M.D. Ph.D.  [facility] [address] [place], [place] [address] Phone: [phone] Fax:      [phone]

## 2017-03-04 ENCOUNTER — Encounter (HOSPITAL_COMMUNITY): Payer: Self-pay | Admitting: *Deleted

## 2017-03-05 ENCOUNTER — Ambulatory Visit (HOSPITAL_COMMUNITY)
Admission: RE | Admit: 2017-03-05 | Discharge: 2017-03-05 | Disposition: A | Discharge status: Home / Self Care | Payer: Commercial Managed Care - HMO | Source: Ambulatory Visit | Attending: Obstetrics and Gynecology | Admitting: Obstetrics and Gynecology

## 2017-03-05 ENCOUNTER — Encounter (HOSPITAL_COMMUNITY): Payer: Self-pay

## 2017-03-05 VITALS — BP 115/74 | HR 94 | Wt 187.6 lb

## 2017-03-05 DIAGNOSIS — Z0489 Encounter for examination and observation for other specified reasons: Secondary | ICD-10-CM

## 2017-03-05 DIAGNOSIS — Z3A21 21 weeks gestation of pregnancy: Secondary | ICD-10-CM | POA: Diagnosis not present

## 2017-03-05 DIAGNOSIS — Z363 Encounter for antenatal screening for malformations: Secondary | ICD-10-CM | POA: Diagnosis not present

## 2017-03-05 DIAGNOSIS — O4102X0 Oligohydramnios, second trimester, not applicable or unspecified: Secondary | ICD-10-CM

## 2017-03-05 DIAGNOSIS — O26842 Uterine size-date discrepancy, second trimester: Secondary | ICD-10-CM | POA: Insufficient documentation

## 2017-03-05 DIAGNOSIS — IMO0002 Reserved for concepts with insufficient information to code with codable children: Secondary | ICD-10-CM

## 2017-03-26 ENCOUNTER — Other Ambulatory Visit (HOSPITAL_COMMUNITY): Payer: Self-pay | Admitting: *Deleted

## 2017-03-26 ENCOUNTER — Ambulatory Visit (HOSPITAL_COMMUNITY)
Admission: RE | Admit: 2017-03-26 | Discharge: 2017-03-26 | Disposition: A | Discharge status: Home / Self Care | Payer: Commercial Managed Care - HMO | Source: Ambulatory Visit | Attending: Obstetrics and Gynecology | Admitting: Obstetrics and Gynecology

## 2017-03-26 ENCOUNTER — Encounter (HOSPITAL_COMMUNITY): Payer: Self-pay

## 2017-03-26 ENCOUNTER — Other Ambulatory Visit: Payer: Self-pay

## 2017-03-26 DIAGNOSIS — Z3A24 24 weeks gestation of pregnancy: Secondary | ICD-10-CM | POA: Diagnosis not present

## 2017-03-26 DIAGNOSIS — O359XX0 Maternal care for (suspected) fetal abnormality and damage, unspecified, not applicable or unspecified: Secondary | ICD-10-CM

## 2017-03-26 DIAGNOSIS — Z0489 Encounter for examination and observation for other specified reasons: Secondary | ICD-10-CM

## 2017-03-26 DIAGNOSIS — IMO0002 Reserved for concepts with insufficient information to code with codable children: Secondary | ICD-10-CM

## 2017-03-26 DIAGNOSIS — O321XX0 Maternal care for breech presentation, not applicable or unspecified: Secondary | ICD-10-CM | POA: Insufficient documentation

## 2017-04-14 ENCOUNTER — Encounter: Payer: Self-pay | Admitting: Registered"

## 2017-04-14 ENCOUNTER — Encounter: Payer: Commercial Managed Care - HMO | Attending: Obstetrics and Gynecology | Admitting: Registered"

## 2017-04-14 DIAGNOSIS — O99810 Abnormal glucose complicating pregnancy: Secondary | ICD-10-CM | POA: Diagnosis not present

## 2017-04-14 DIAGNOSIS — Z713 Dietary counseling and surveillance: Secondary | ICD-10-CM | POA: Insufficient documentation

## 2017-04-14 DIAGNOSIS — R7309 Other abnormal glucose: Secondary | ICD-10-CM

## 2017-04-14 DIAGNOSIS — Z3A00 Weeks of gestation of pregnancy not specified: Secondary | ICD-10-CM | POA: Diagnosis not present

## 2017-04-14 NOTE — Progress Notes (Signed)
Patient was seen on 04/14/2017 for Gestational Diabetes self-management class at the [facility]. The following learning objectives were met by the patient during this course:   States the definition of Gestational Diabetes  States why dietary management is important in controlling blood glucose  Describes the effects each nutrient has on blood glucose levels  Demonstrates ability to create a balanced meal plan  Demonstrates carbohydrate counting   States when to check blood glucose levels  Demonstrates proper blood glucose monitoring techniques  States the effect of stress and exercise on blood glucose levels  States the importance of limiting caffeine and abstaining from alcohol and smoking  Blood glucose monitor given: [employer] Lot # [id number] Exp: 2017-07-23 Blood glucose reading: 108  Patient instructed to monitor glucose levels: FBS: 60 - <90 1 hour: <140 2 hour: <120  Patient received handouts:  Nutrition Diabetes and Pregnancy  Carbohydrate Counting List  Patient will be seen for follow-up as needed.

## 2017-04-20 ENCOUNTER — Other Ambulatory Visit (HOSPITAL_COMMUNITY): Payer: Self-pay | Admitting: *Deleted

## 2017-04-20 ENCOUNTER — Encounter (HOSPITAL_COMMUNITY): Payer: Self-pay

## 2017-04-20 ENCOUNTER — Ambulatory Visit (HOSPITAL_COMMUNITY)
Admission: RE | Admit: 2017-04-20 | Discharge: 2017-04-20 | Disposition: A | Discharge status: Home / Self Care | Payer: Commercial Managed Care - HMO | Source: Ambulatory Visit | Attending: Obstetrics and Gynecology | Admitting: Obstetrics and Gynecology

## 2017-04-20 DIAGNOSIS — O359XX0 Maternal care for (suspected) fetal abnormality and damage, unspecified, not applicable or unspecified: Secondary | ICD-10-CM | POA: Insufficient documentation

## 2017-04-20 DIAGNOSIS — O24419 Gestational diabetes mellitus in pregnancy, unspecified control: Secondary | ICD-10-CM

## 2017-04-20 DIAGNOSIS — Z3A27 27 weeks gestation of pregnancy: Secondary | ICD-10-CM | POA: Diagnosis not present

## 2017-06-01 ENCOUNTER — Ambulatory Visit (HOSPITAL_COMMUNITY)
Admission: RE | Admit: 2017-06-01 | Discharge: 2017-06-01 | Disposition: A | Discharge status: Home / Self Care | Payer: 59 | Source: Ambulatory Visit | Attending: Obstetrics and Gynecology | Admitting: Obstetrics and Gynecology

## 2017-06-01 ENCOUNTER — Other Ambulatory Visit (HOSPITAL_COMMUNITY): Payer: Self-pay | Admitting: *Deleted

## 2017-06-01 ENCOUNTER — Other Ambulatory Visit (HOSPITAL_COMMUNITY): Payer: Self-pay | Admitting: Maternal and Fetal Medicine

## 2017-06-01 ENCOUNTER — Encounter (HOSPITAL_COMMUNITY): Payer: Self-pay

## 2017-06-01 DIAGNOSIS — IMO0002 Reserved for concepts with insufficient information to code with codable children: Secondary | ICD-10-CM

## 2017-06-01 DIAGNOSIS — O365990 Maternal care for other known or suspected poor fetal growth, unspecified trimester, not applicable or unspecified: Secondary | ICD-10-CM

## 2017-06-01 DIAGNOSIS — O24410 Gestational diabetes mellitus in pregnancy, diet controlled: Secondary | ICD-10-CM | POA: Diagnosis not present

## 2017-06-01 DIAGNOSIS — O24419 Gestational diabetes mellitus in pregnancy, unspecified control: Secondary | ICD-10-CM

## 2017-06-01 DIAGNOSIS — Z362 Encounter for other antenatal screening follow-up: Secondary | ICD-10-CM

## 2017-06-01 DIAGNOSIS — Z3A33 33 weeks gestation of pregnancy: Secondary | ICD-10-CM | POA: Insufficient documentation

## 2017-06-01 HISTORY — DX: Gestational diabetes mellitus in pregnancy, unspecified control: O24.419

## 2017-06-02 ENCOUNTER — Other Ambulatory Visit (HOSPITAL_COMMUNITY): Payer: Self-pay | Admitting: *Deleted

## 2017-06-02 DIAGNOSIS — O365930 Maternal care for other known or suspected poor fetal growth, third trimester, not applicable or unspecified: Secondary | ICD-10-CM

## 2017-06-08 ENCOUNTER — Ambulatory Visit (HOSPITAL_COMMUNITY)
Admission: RE | Admit: 2017-06-08 | Discharge: 2017-06-08 | Disposition: A | Discharge status: Home / Self Care | Payer: 59 | Source: Ambulatory Visit | Attending: Obstetrics and Gynecology | Admitting: Obstetrics and Gynecology

## 2017-06-08 ENCOUNTER — Other Ambulatory Visit (HOSPITAL_COMMUNITY): Payer: Self-pay | Admitting: Maternal and Fetal Medicine

## 2017-06-08 ENCOUNTER — Encounter (HOSPITAL_COMMUNITY): Payer: Self-pay

## 2017-06-08 DIAGNOSIS — O365930 Maternal care for other known or suspected poor fetal growth, third trimester, not applicable or unspecified: Secondary | ICD-10-CM | POA: Insufficient documentation

## 2017-06-08 DIAGNOSIS — Z3A34 34 weeks gestation of pregnancy: Secondary | ICD-10-CM | POA: Diagnosis not present

## 2017-06-08 DIAGNOSIS — IMO0002 Reserved for concepts with insufficient information to code with codable children: Secondary | ICD-10-CM

## 2017-06-08 DIAGNOSIS — O24410 Gestational diabetes mellitus in pregnancy, diet controlled: Secondary | ICD-10-CM | POA: Insufficient documentation

## 2017-06-15 ENCOUNTER — Ambulatory Visit (HOSPITAL_COMMUNITY)
Admission: RE | Admit: 2017-06-15 | Discharge: 2017-06-15 | Disposition: A | Discharge status: Home / Self Care | Payer: 59 | Source: Ambulatory Visit | Attending: Obstetrics and Gynecology | Admitting: Obstetrics and Gynecology

## 2017-06-15 ENCOUNTER — Encounter (HOSPITAL_COMMUNITY): Payer: Self-pay

## 2017-06-15 DIAGNOSIS — O24410 Gestational diabetes mellitus in pregnancy, diet controlled: Secondary | ICD-10-CM | POA: Insufficient documentation

## 2017-06-15 DIAGNOSIS — Z3A35 35 weeks gestation of pregnancy: Secondary | ICD-10-CM | POA: Diagnosis not present

## 2017-06-15 DIAGNOSIS — O365930 Maternal care for other known or suspected poor fetal growth, third trimester, not applicable or unspecified: Secondary | ICD-10-CM

## 2017-06-18 LAB — OB RESULTS CONSOLE GBS: GBS: NEGATIVE

## 2017-06-22 ENCOUNTER — Other Ambulatory Visit (HOSPITAL_COMMUNITY): Payer: Self-pay | Admitting: Maternal and Fetal Medicine

## 2017-06-22 ENCOUNTER — Encounter (HOSPITAL_COMMUNITY): Payer: Self-pay

## 2017-06-22 ENCOUNTER — Ambulatory Visit (HOSPITAL_COMMUNITY)
Admission: RE | Admit: 2017-06-22 | Discharge: 2017-06-22 | Disposition: A | Discharge status: Home / Self Care | Payer: 59 | Source: Ambulatory Visit | Attending: Obstetrics and Gynecology | Admitting: Obstetrics and Gynecology

## 2017-06-22 ENCOUNTER — Other Ambulatory Visit (HOSPITAL_COMMUNITY): Payer: Self-pay | Admitting: *Deleted

## 2017-06-22 DIAGNOSIS — O365930 Maternal care for other known or suspected poor fetal growth, third trimester, not applicable or unspecified: Secondary | ICD-10-CM | POA: Diagnosis not present

## 2017-06-22 DIAGNOSIS — O288 Other abnormal findings on antenatal screening of mother: Secondary | ICD-10-CM

## 2017-06-22 DIAGNOSIS — O24410 Gestational diabetes mellitus in pregnancy, diet controlled: Secondary | ICD-10-CM | POA: Diagnosis not present

## 2017-06-22 DIAGNOSIS — Z3A36 36 weeks gestation of pregnancy: Secondary | ICD-10-CM | POA: Diagnosis not present

## 2017-06-22 DIAGNOSIS — O24419 Gestational diabetes mellitus in pregnancy, unspecified control: Secondary | ICD-10-CM

## 2017-06-22 DIAGNOSIS — IMO0002 Reserved for concepts with insufficient information to code with codable children: Secondary | ICD-10-CM

## 2017-06-24 ENCOUNTER — Other Ambulatory Visit (HOSPITAL_COMMUNITY): Payer: Self-pay | Admitting: Obstetrics and Gynecology

## 2017-06-24 ENCOUNTER — Ambulatory Visit (HOSPITAL_COMMUNITY)
Admission: RE | Admit: 2017-06-24 | Discharge: 2017-06-24 | Disposition: A | Discharge status: Home / Self Care | Payer: 59 | Source: Ambulatory Visit | Attending: Obstetrics and Gynecology | Admitting: Obstetrics and Gynecology

## 2017-06-24 ENCOUNTER — Encounter (HOSPITAL_COMMUNITY): Payer: Self-pay

## 2017-06-24 DIAGNOSIS — O288 Other abnormal findings on antenatal screening of mother: Secondary | ICD-10-CM | POA: Insufficient documentation

## 2017-06-24 DIAGNOSIS — O365930 Maternal care for other known or suspected poor fetal growth, third trimester, not applicable or unspecified: Secondary | ICD-10-CM | POA: Insufficient documentation

## 2017-06-24 DIAGNOSIS — O24410 Gestational diabetes mellitus in pregnancy, diet controlled: Secondary | ICD-10-CM | POA: Diagnosis not present

## 2017-06-24 DIAGNOSIS — Z3A36 36 weeks gestation of pregnancy: Secondary | ICD-10-CM

## 2017-06-24 DIAGNOSIS — O4100X0 Oligohydramnios, unspecified trimester, not applicable or unspecified: Secondary | ICD-10-CM | POA: Insufficient documentation

## 2017-06-28 ENCOUNTER — Telehealth (HOSPITAL_COMMUNITY): Payer: Self-pay | Admitting: *Deleted

## 2017-06-28 NOTE — Telephone Encounter (Signed)
Preadmission screen  

## 2017-06-29 ENCOUNTER — Encounter (HOSPITAL_COMMUNITY): Payer: Self-pay

## 2017-06-29 ENCOUNTER — Ambulatory Visit (HOSPITAL_COMMUNITY)
Admission: RE | Admit: 2017-06-29 | Discharge: 2017-06-29 | Disposition: A | Discharge status: Home / Self Care | Payer: 59 | Source: Ambulatory Visit | Attending: Obstetrics and Gynecology | Admitting: Obstetrics and Gynecology

## 2017-06-29 ENCOUNTER — Other Ambulatory Visit (HOSPITAL_COMMUNITY): Payer: Self-pay | Admitting: Maternal and Fetal Medicine

## 2017-06-29 DIAGNOSIS — O365930 Maternal care for other known or suspected poor fetal growth, third trimester, not applicable or unspecified: Secondary | ICD-10-CM | POA: Insufficient documentation

## 2017-06-29 DIAGNOSIS — O24410 Gestational diabetes mellitus in pregnancy, diet controlled: Secondary | ICD-10-CM | POA: Diagnosis not present

## 2017-06-29 DIAGNOSIS — Z3A37 37 weeks gestation of pregnancy: Secondary | ICD-10-CM | POA: Diagnosis not present

## 2017-06-30 ENCOUNTER — Encounter (HOSPITAL_COMMUNITY): Payer: Self-pay

## 2017-07-06 ENCOUNTER — Ambulatory Visit (HOSPITAL_COMMUNITY)
Admission: RE | Admit: 2017-07-06 | Discharge: 2017-07-06 | Disposition: A | Discharge status: Home / Self Care | Payer: 59 | Source: Ambulatory Visit | Attending: Obstetrics and Gynecology | Admitting: Obstetrics and Gynecology

## 2017-07-06 ENCOUNTER — Encounter (HOSPITAL_COMMUNITY): Payer: Self-pay

## 2017-07-06 ENCOUNTER — Other Ambulatory Visit (HOSPITAL_COMMUNITY): Payer: Self-pay | Admitting: Maternal and Fetal Medicine

## 2017-07-06 DIAGNOSIS — O365930 Maternal care for other known or suspected poor fetal growth, third trimester, not applicable or unspecified: Secondary | ICD-10-CM

## 2017-07-06 DIAGNOSIS — O321XX0 Maternal care for breech presentation, not applicable or unspecified: Secondary | ICD-10-CM | POA: Diagnosis not present

## 2017-07-06 DIAGNOSIS — O24410 Gestational diabetes mellitus in pregnancy, diet controlled: Secondary | ICD-10-CM | POA: Insufficient documentation

## 2017-07-06 DIAGNOSIS — O24419 Gestational diabetes mellitus in pregnancy, unspecified control: Secondary | ICD-10-CM

## 2017-07-06 DIAGNOSIS — Z3A38 38 weeks gestation of pregnancy: Secondary | ICD-10-CM

## 2017-07-06 NOTE — Procedures (Signed)
Sherry Johnson 12/15/1990 [id number]  Fetus A Non-Stress Test Interpretation for 07/06/17  Indication: Unsatisfactory BPP  Fetal Heart Rate A Mode: External Baseline Rate (A): 140 bpm Variability: Moderate Accelerations: 10 x 10, 15 x 15 Decelerations: None Multiple birth?: No  Uterine Activity Mode: Palpation, Toco Contraction Frequency (min): none  Interpretation (Fetal Testing) Nonstress Test Interpretation: Reactive Overall Impression: Reassuring for gestational age Comments: Reviewed tracing with Dr. Claudean SeveranceWhitecar

## 2017-07-09 ENCOUNTER — Other Ambulatory Visit: Payer: Self-pay | Admitting: Obstetrics and Gynecology

## 2017-07-12 ENCOUNTER — Encounter (HOSPITAL_COMMUNITY)
Admission: RE | Admit: 2017-07-12 | Discharge: 2017-07-12 | Disposition: A | Discharge status: Home / Self Care | Payer: 59 | Source: Ambulatory Visit | Attending: Obstetrics and Gynecology | Admitting: Obstetrics and Gynecology

## 2017-07-12 DIAGNOSIS — Z01812 Encounter for preprocedural laboratory examination: Secondary | ICD-10-CM

## 2017-07-12 DIAGNOSIS — Z0183 Encounter for blood typing: Secondary | ICD-10-CM | POA: Insufficient documentation

## 2017-07-12 LAB — TYPE AND SCREEN
ABO/RH(D): A POS
Antibody Screen: NEGATIVE

## 2017-07-12 LAB — CBC
HCT: 38 % (ref 36.0–46.0)
HEMOGLOBIN: 12.6 g/dL (ref 12.0–15.0)
MCH: 30.4 pg (ref 26.0–34.0)
MCHC: 33.2 g/dL (ref 30.0–36.0)
MCV: 91.6 fL (ref 78.0–100.0)
Platelets: 185 10*3/uL (ref 150–400)
RBC: 4.15 MIL/uL (ref 3.87–5.11)
RDW: 14.2 % (ref 11.5–15.5)
WBC: 8.8 10*3/uL (ref 4.0–10.5)

## 2017-07-12 LAB — ABO/RH: ABO/RH(D): A POS

## 2017-07-12 NOTE — Patient Instructions (Signed)
20 Sherry Johnson  07/12/2017   Your procedure is scheduled on:  07/13/2017  Enter through the Main Entrance of [facility] at 0630 AM.  Pick up the phone at the desk and dial [phone].   Call this number if you have problems the morning of surgery: [phone]   Remember:   Do not eat food:After Midnight.  Do not drink clear liquids: After Midnight.  Take these medicines the morning of surgery with A SIP OF WATER: none   Do not wear jewelry, make-up or nail polish.  Do not wear lotions, powders, or perfumes. Do not wear deodorant.  Do not shave 48 hours prior to surgery.  Do not bring valuables to the hospital.  [facility] is not   responsible for any belongings or valuables brought to the hospital.  Contacts, dentures or bridgework may not be worn into surgery.  Leave suitcase in the car. After surgery it may be brought to your room.  For patients admitted to the hospital, checkout time is 11:00 AM the day of              discharge.   Patients discharged the day of surgery will not be allowed to drive             home.  Name and phone number of your driver: na  Special Instructions:   N/A   Please read over the following fact sheets that you were given:   Surgical Site Infection Prevention

## 2017-07-13 ENCOUNTER — Ambulatory Visit (HOSPITAL_COMMUNITY): Payer: Commercial Managed Care - HMO

## 2017-07-13 ENCOUNTER — Encounter (HOSPITAL_COMMUNITY)
Admission: AD | Disposition: A | Discharge status: Home / Self Care | Payer: Self-pay | Source: Ambulatory Visit | Attending: Obstetrics and Gynecology

## 2017-07-13 ENCOUNTER — Inpatient Hospital Stay (HOSPITAL_COMMUNITY): Payer: 59 | Admitting: Anesthesiology

## 2017-07-13 ENCOUNTER — Inpatient Hospital Stay (HOSPITAL_COMMUNITY)
Admission: AD | Admit: 2017-07-13 | Discharge: 2017-07-15 | DRG: 765 | Disposition: A | Discharge status: Home / Self Care | Payer: 59 | Source: Ambulatory Visit | Attending: Obstetrics and Gynecology | Admitting: Obstetrics and Gynecology

## 2017-07-13 ENCOUNTER — Encounter (HOSPITAL_COMMUNITY): Payer: Self-pay | Admitting: Anesthesiology

## 2017-07-13 DIAGNOSIS — O365930 Maternal care for other known or suspected poor fetal growth, third trimester, not applicable or unspecified: Secondary | ICD-10-CM | POA: Diagnosis present

## 2017-07-13 DIAGNOSIS — Z3A39 39 weeks gestation of pregnancy: Secondary | ICD-10-CM | POA: Diagnosis not present

## 2017-07-13 DIAGNOSIS — O321XX0 Maternal care for breech presentation, not applicable or unspecified: Secondary | ICD-10-CM | POA: Diagnosis present

## 2017-07-13 DIAGNOSIS — O9081 Anemia of the puerperium: Secondary | ICD-10-CM | POA: Diagnosis not present

## 2017-07-13 DIAGNOSIS — O24420 Gestational diabetes mellitus in childbirth, diet controlled: Secondary | ICD-10-CM | POA: Diagnosis present

## 2017-07-13 LAB — RPR: RPR Ser Ql: NONREACTIVE

## 2017-07-13 LAB — GLUCOSE, CAPILLARY
Glucose-Capillary: 71 mg/dL (ref 65–99)
Glucose-Capillary: 84 mg/dL (ref 65–99)

## 2017-07-13 SURGERY — Surgical Case
Anesthesia: Spinal

## 2017-07-13 MED ORDER — WITCH HAZEL-GLYCERIN EX PADS: 1.0000 "application " | MEDICATED_PAD | CUTANEOUS | Status: DC | PRN

## 2017-07-13 MED ORDER — METOCLOPRAMIDE HCL 5 MG/ML IJ SOLN
INTRAMUSCULAR | Status: AC
  Filled 2017-07-13: qty 2

## 2017-07-13 MED ORDER — SENNOSIDES-DOCUSATE SODIUM 8.6-50 MG PO TABS
2.0000 | ORAL_TABLET | ORAL | Status: DC
  Administered 2017-07-13 – 2017-07-14 (×2): 2 via ORAL
  Filled 2017-07-13 (×2): qty 2

## 2017-07-13 MED ORDER — METOCLOPRAMIDE HCL 5 MG/ML IJ SOLN
INTRAMUSCULAR | Status: DC | PRN
  Administered 2017-07-13 (×2): 5 mg via INTRAVENOUS

## 2017-07-13 MED ORDER — BUPIVACAINE IN DEXTROSE 0.75-8.25 % IT SOLN
INTRATHECAL | Status: AC
  Filled 2017-07-13: qty 2

## 2017-07-13 MED ORDER — IBUPROFEN 600 MG PO TABS
600.0000 mg | ORAL_TABLET | Freq: Four times a day (QID) | ORAL | Status: DC
  Administered 2017-07-13 – 2017-07-15 (×7): 600 mg via ORAL
  Filled 2017-07-13 (×7): qty 1

## 2017-07-13 MED ORDER — MENTHOL 3 MG MT LOZG: 1.0000 | LOZENGE | OROMUCOSAL | Status: DC | PRN

## 2017-07-13 MED ORDER — NALBUPHINE HCL 10 MG/ML IJ SOLN: 5.0000 mg | INTRAMUSCULAR | Status: DC | PRN

## 2017-07-13 MED ORDER — DIPHENHYDRAMINE HCL 25 MG PO CAPS
25.0000 mg | ORAL_CAPSULE | Freq: Four times a day (QID) | ORAL | Status: DC | PRN
  Administered 2017-07-13 (×2): 25 mg via ORAL
  Filled 2017-07-13 (×2): qty 1

## 2017-07-13 MED ORDER — ONDANSETRON HCL 4 MG/2ML IJ SOLN: 4.0000 mg | Freq: Three times a day (TID) | INTRAMUSCULAR | Status: DC | PRN

## 2017-07-13 MED ORDER — SIMETHICONE 80 MG PO CHEW
80.0000 mg | CHEWABLE_TABLET | ORAL | Status: DC
  Administered 2017-07-13 – 2017-07-14 (×2): 80 mg via ORAL
  Filled 2017-07-13 (×2): qty 1

## 2017-07-13 MED ORDER — MEPERIDINE HCL 25 MG/ML IJ SOLN: 6.2500 mg | INTRAMUSCULAR | Status: DC | PRN

## 2017-07-13 MED ORDER — NALBUPHINE HCL 10 MG/ML IJ SOLN: 5.0000 mg | Freq: Once | INTRAMUSCULAR | Status: DC | PRN

## 2017-07-13 MED ORDER — TETANUS-DIPHTH-ACELL PERTUSSIS 5-2.5-18.5 LF-MCG/0.5 IM SUSP: 0.5000 mL | Freq: Once | INTRAMUSCULAR | Status: DC

## 2017-07-13 MED ORDER — MORPHINE SULFATE (PF) 0.5 MG/ML IJ SOLN
INTRAMUSCULAR | Status: DC | PRN
  Administered 2017-07-13: .2 mg via INTRATHECAL

## 2017-07-13 MED ORDER — COCONUT OIL OIL: 1.0000 "application " | TOPICAL_OIL | Status: DC | PRN

## 2017-07-13 MED ORDER — OXYTOCIN 10 UNIT/ML IJ SOLN
INTRAMUSCULAR | Status: AC
  Filled 2017-07-13: qty 4

## 2017-07-13 MED ORDER — FENTANYL CITRATE (PF) 100 MCG/2ML IJ SOLN: 25.0000 ug | INTRAMUSCULAR | Status: DC | PRN

## 2017-07-13 MED ORDER — KETOROLAC TROMETHAMINE 30 MG/ML IJ SOLN
30.0000 mg | Freq: Four times a day (QID) | INTRAMUSCULAR | Status: DC | PRN
  Administered 2017-07-13: 30 mg via INTRAMUSCULAR

## 2017-07-13 MED ORDER — KETOROLAC TROMETHAMINE 30 MG/ML IJ SOLN
INTRAMUSCULAR | Status: AC
  Filled 2017-07-13: qty 1

## 2017-07-13 MED ORDER — PHENYLEPHRINE 8 MG IN D5W 100 ML (0.08MG/ML) PREMIX OPTIME
INJECTION | INTRAVENOUS | Status: AC
  Filled 2017-07-13: qty 100

## 2017-07-13 MED ORDER — SIMETHICONE 80 MG PO CHEW: 80.0000 mg | CHEWABLE_TABLET | ORAL | Status: DC | PRN

## 2017-07-13 MED ORDER — CEFAZOLIN SODIUM-DEXTROSE 2-4 GM/100ML-% IV SOLN
2.0000 g | INTRAVENOUS | Status: AC
  Administered 2017-07-13: 2 g via INTRAVENOUS

## 2017-07-13 MED ORDER — ACETAMINOPHEN 325 MG PO TABS
650.0000 mg | ORAL_TABLET | ORAL | Status: DC | PRN
  Administered 2017-07-13 – 2017-07-14 (×3): 650 mg via ORAL
  Filled 2017-07-13 (×3): qty 2

## 2017-07-13 MED ORDER — SOD CITRATE-CITRIC ACID 500-334 MG/5ML PO SOLN: 30.0000 mL | Freq: Once | ORAL | Status: DC

## 2017-07-13 MED ORDER — MORPHINE SULFATE (PF) 0.5 MG/ML IJ SOLN
INTRAMUSCULAR | Status: AC
  Filled 2017-07-13: qty 10

## 2017-07-13 MED ORDER — SODIUM CHLORIDE 0.9% FLUSH: 3.0000 mL | INTRAVENOUS | Status: DC | PRN

## 2017-07-13 MED ORDER — SIMETHICONE 80 MG PO CHEW
80.0000 mg | CHEWABLE_TABLET | Freq: Three times a day (TID) | ORAL | Status: DC
  Administered 2017-07-13 – 2017-07-15 (×3): 80 mg via ORAL
  Filled 2017-07-13 (×3): qty 1

## 2017-07-13 MED ORDER — KETOROLAC TROMETHAMINE 30 MG/ML IJ SOLN: 30.0000 mg | Freq: Four times a day (QID) | INTRAMUSCULAR | Status: DC | PRN

## 2017-07-13 MED ORDER — ONDANSETRON HCL 4 MG/2ML IJ SOLN
INTRAMUSCULAR | Status: DC | PRN
  Administered 2017-07-13: 4 mg via INTRAVENOUS

## 2017-07-13 MED ORDER — NALOXONE HCL 0.4 MG/ML IJ SOLN: 0.4000 mg | INTRAMUSCULAR | Status: DC | PRN

## 2017-07-13 MED ORDER — PHENYLEPHRINE 8 MG IN D5W 100 ML (0.08MG/ML) PREMIX OPTIME
INJECTION | INTRAVENOUS | Status: DC | PRN
  Administered 2017-07-13: 60 ug/min via INTRAVENOUS

## 2017-07-13 MED ORDER — OXYTOCIN 40 UNITS IN LACTATED RINGERS INFUSION - SIMPLE MED: 2.5000 [IU]/h | INTRAVENOUS | Status: AC

## 2017-07-13 MED ORDER — LACTATED RINGERS IV SOLN
INTRAVENOUS | Status: DC | PRN
  Administered 2017-07-13: 40 [IU] via INTRAVENOUS

## 2017-07-13 MED ORDER — ZOLPIDEM TARTRATE 5 MG PO TABS: 5.0000 mg | ORAL_TABLET | Freq: Every evening | ORAL | Status: DC | PRN

## 2017-07-13 MED ORDER — LACTATED RINGERS IV SOLN
INTRAVENOUS | Status: DC
  Administered 2017-07-13: 16:00:00 via INTRAVENOUS

## 2017-07-13 MED ORDER — METOCLOPRAMIDE HCL 5 MG/ML IJ SOLN: 10.0000 mg | Freq: Once | INTRAMUSCULAR | Status: DC | PRN

## 2017-07-13 MED ORDER — ONDANSETRON HCL 4 MG/2ML IJ SOLN
INTRAMUSCULAR | Status: AC
  Filled 2017-07-13: qty 2

## 2017-07-13 MED ORDER — DIPHENHYDRAMINE HCL 25 MG PO CAPS: 25.0000 mg | ORAL_CAPSULE | ORAL | Status: DC | PRN

## 2017-07-13 MED ORDER — BUPIVACAINE IN DEXTROSE 0.75-8.25 % IT SOLN
INTRATHECAL | Status: DC | PRN
  Administered 2017-07-13: 10.5 mg via INTRATHECAL

## 2017-07-13 MED ORDER — OXYCODONE-ACETAMINOPHEN 5-325 MG PO TABS
2.0000 | ORAL_TABLET | ORAL | Status: DC | PRN
  Administered 2017-07-14 – 2017-07-15 (×3): 2 via ORAL
  Filled 2017-07-13 (×3): qty 2

## 2017-07-13 MED ORDER — NALOXONE HCL 2 MG/2ML IJ SOSY
1.0000 ug/kg/h | PREFILLED_SYRINGE | INTRAVENOUS | Status: DC | PRN
  Filled 2017-07-13: qty 2

## 2017-07-13 MED ORDER — SCOPOLAMINE 1 MG/3DAYS TD PT72
1.0000 | MEDICATED_PATCH | TRANSDERMAL | Status: DC
  Administered 2017-07-13: 1.5 mg via TRANSDERMAL

## 2017-07-13 MED ORDER — METHYLERGONOVINE MALEATE 0.2 MG/ML IJ SOLN: 0.2000 mg | INTRAMUSCULAR | Status: DC | PRN

## 2017-07-13 MED ORDER — DIPHENHYDRAMINE HCL 50 MG/ML IJ SOLN: 12.5000 mg | INTRAMUSCULAR | Status: DC | PRN

## 2017-07-13 MED ORDER — PRENATAL MULTIVITAMIN CH
1.0000 | ORAL_TABLET | Freq: Every day | ORAL | Status: DC
  Administered 2017-07-14: 1 via ORAL
  Filled 2017-07-13: qty 1

## 2017-07-13 MED ORDER — SOD CITRATE-CITRIC ACID 500-334 MG/5ML PO SOLN
ORAL | Status: AC
  Filled 2017-07-13: qty 15

## 2017-07-13 MED ORDER — CEFAZOLIN SODIUM-DEXTROSE 2-4 GM/100ML-% IV SOLN
2.0000 g | INTRAVENOUS | Status: DC
  Filled 2017-07-13: qty 100

## 2017-07-13 MED ORDER — FENTANYL CITRATE (PF) 100 MCG/2ML IJ SOLN
INTRAMUSCULAR | Status: DC | PRN
  Administered 2017-07-13: 20 ug via INTRATHECAL

## 2017-07-13 MED ORDER — DIBUCAINE 1 % RE OINT: 1.0000 "application " | TOPICAL_OINTMENT | RECTAL | Status: DC | PRN

## 2017-07-13 MED ORDER — SCOPOLAMINE 1 MG/3DAYS TD PT72
MEDICATED_PATCH | TRANSDERMAL | Status: AC
  Filled 2017-07-13: qty 1

## 2017-07-13 MED ORDER — OXYCODONE-ACETAMINOPHEN 5-325 MG PO TABS
1.0000 | ORAL_TABLET | ORAL | Status: DC | PRN
  Administered 2017-07-14 – 2017-07-15 (×2): 1 via ORAL
  Filled 2017-07-13 (×3): qty 1

## 2017-07-13 MED ORDER — METHYLERGONOVINE MALEATE 0.2 MG PO TABS: 0.2000 mg | ORAL_TABLET | ORAL | Status: DC | PRN

## 2017-07-13 MED ORDER — FENTANYL CITRATE (PF) 100 MCG/2ML IJ SOLN
INTRAMUSCULAR | Status: AC
  Filled 2017-07-13: qty 2

## 2017-07-13 MED ORDER — DEXAMETHASONE SODIUM PHOSPHATE 4 MG/ML IJ SOLN
INTRAMUSCULAR | Status: AC
  Filled 2017-07-13: qty 1

## 2017-07-13 MED ORDER — LACTATED RINGERS IV SOLN
INTRAVENOUS | Status: DC
  Administered 2017-07-13 (×3): via INTRAVENOUS

## 2017-07-13 SURGICAL SUPPLY — 35 items
ADH SKN CLS APL DERMABOND .7 (GAUZE/BANDAGES/DRESSINGS) ×1
CHLORAPREP W/TINT 26ML (MISCELLANEOUS) ×3 IMPLANT
CLAMP CORD UMBIL (MISCELLANEOUS) IMPLANT
CLOTH BEACON ORANGE TIMEOUT ST (SAFETY) ×3 IMPLANT
DERMABOND ADVANCED (GAUZE/BANDAGES/DRESSINGS) ×2
DERMABOND ADVANCED .7 DNX12 (GAUZE/BANDAGES/DRESSINGS) IMPLANT
DRSG OPSITE POSTOP 4X10 (GAUZE/BANDAGES/DRESSINGS) ×3 IMPLANT
ELECT REM PT RETURN 9FT ADLT (ELECTROSURGICAL) ×3
ELECTRODE REM PT RTRN 9FT ADLT (ELECTROSURGICAL) ×1 IMPLANT
EXTRACTOR VACUUM M CUP 4 TUBE (SUCTIONS) IMPLANT
EXTRACTOR VACUUM M CUP 4' TUBE (SUCTIONS)
GLOVE BIO SURGEON STRL SZ7 (GLOVE) ×3 IMPLANT
GLOVE BIOGEL PI IND STRL 7.0 (GLOVE) ×1 IMPLANT
GLOVE BIOGEL PI INDICATOR 7.0 (GLOVE) ×2
GOWN STRL REUS W/TWL LRG LVL3 (GOWN DISPOSABLE) ×6 IMPLANT
KIT ABG SYR 3ML LUER SLIP (SYRINGE) IMPLANT
NDL HYPO 25X5/8 SAFETYGLIDE (NEEDLE) IMPLANT
NEEDLE HYPO 22GX1.5 SAFETY (NEEDLE) IMPLANT
NEEDLE HYPO 25X5/8 SAFETYGLIDE (NEEDLE) IMPLANT
NS IRRIG 1000ML POUR BTL (IV SOLUTION) ×3 IMPLANT
PACK C SECTION WH (CUSTOM PROCEDURE TRAY) ×3 IMPLANT
PAD OB MATERNITY 4.3X12.25 (PERSONAL CARE ITEMS) ×3 IMPLANT
PENCIL SMOKE EVAC W/HOLSTER (ELECTROSURGICAL) ×3 IMPLANT
RTRCTR C-SECT PINK 25CM LRG (MISCELLANEOUS) ×3 IMPLANT
SPONGE LAP 18X18 X RAY DECT (DISPOSABLE) ×2 IMPLANT
SUT CHROMIC 1 CTX 36 (SUTURE) ×10 IMPLANT
SUT CHROMIC 2 0 CT 1 (SUTURE) ×3 IMPLANT
SUT PDS AB 0 CTX 60 (SUTURE) ×3 IMPLANT
SUT PLAIN 2 0 XLH (SUTURE) ×2 IMPLANT
SUT VIC AB 2-0 CT1 27 (SUTURE) ×3
SUT VIC AB 2-0 CT1 TAPERPNT 27 (SUTURE) ×1 IMPLANT
SUT VIC AB 4-0 KS 27 (SUTURE) IMPLANT
SYR 30ML LL (SYRINGE) IMPLANT
TOWEL OR 17X24 6PK STRL BLUE (TOWEL DISPOSABLE) ×3 IMPLANT
TRAY FOLEY BAG SILVER LF 14FR (SET/KITS/TRAYS/PACK) ×3 IMPLANT

## 2017-07-13 NOTE — H&P (Signed)
Sherry Johnson is a 26 y.o. female presenting for primary cesarean section  26 yo G1P0 @39 +4 who presents for primary cesarean section for breech presentation. The patient's pregnancy has been complicated by low amniotic fluid, small for gestational age, echogenic bowel, renal pyelectasis and persistant breech presentation. NIPT was normal female. Pt is also A1 gestational diabetic OB History    Gravida Para Term Preterm AB Living   1         0   SAB TAB Ectopic Multiple Live Births                 Past Medical History:  Diagnosis Date  . Chronic headaches   . Gallstones   . Gestational diabetes   . Kidney stone 04/2013   Past Surgical History:  Procedure Laterality Date  . CHOLECYSTECTOMY    . CHOLECYSTECTOMY    . WISDOM TOOTH EXTRACTION     Family History: family history includes Aneurysm in her mother; Breast cancer in her paternal aunt; Colon cancer in her paternal aunt; Diabetes in her father, maternal aunt, and maternal grandfather; Heart attack in her maternal grandfather and mother; Heart disease in her maternal grandmother; Lung cancer in her maternal grandmother and paternal aunt; Prostate cancer in her maternal grandfather; Stroke in her father; Thrombosis in her mother. Social History:  reports that she has never smoked. She has never used smokeless tobacco. She reports that she does not drink alcohol or use drugs.     Maternal Diabetes: Yes:  Diabetes Type:  Diet controlled Genetic Screening: Normal Maternal Ultrasounds/Referrals: Abnormal:  Findings:   IUGR, Fetal renal pyelectasis, Echogenic bowel Fetal Ultrasounds or other Referrals:  Referred to Materal Fetal Medicine  Maternal Substance Abuse:  No Significant Maternal Medications:  None Significant Maternal Lab Results:  None Other Comments:  None  ROS History   Blood pressure (!) 140/92, pulse (!) 104, temperature 98.8 F (37.1 C), temperature source Oral, resp. rate 16, height 5' (1.524 m), weight 87.1 kg  (192 lb), last menstrual period 10/09/2016. Exam Physical Exam  Prenatal labs: ABO, Rh: --/--/A POS, A POS (08/20 1020) Antibody: NEG (08/20 1020) Rubella: Immune (02/06 0000) RPR: Non Reactive (08/20 1020)  HBsAg: Negative (02/06 0000)  HIV: Non-reactive (02/06 0000)  GBS: Negative (07/27 0000)   Assessment/Plan: 1) Admit 2) Proceed with primary cesarean section for breech presentation 3) SCDs   Tanay Massiah H. 07/13/2017, 7:58 AM

## 2017-07-13 NOTE — Op Note (Signed)
Pre-Operative Diagnosis: 1) 39+4 week intrauterine pregnancy 2) Frank breech presentation Postoperative Diagnosis: 1) 39+4 week intrauterine pregnancy 2) Homero Fellers breech presentation Procedure: Primary low transverse cesarean section via Pfannenstiel skin incision Surgeon: Dr. Waynard Reeds Assistant: Dr. Essie Hart Operative Findings: Vigorous female infant in the frank breech presentation with Apgars at 1 minute of 9 and Apgar of 9 at 5 minutes. Normal ovaries tubes and uterus. Extension of the right hand aspect of the uterine incision into the right uterine artery. Hemostasis was ultimately achieved with an Antony Salmon suture Specimen: Placenta to pathology EBL: Total I/O In: 2300 [I.V.:2300] Out: 1525 [Urine:200; Blood:1325]   Procedure:Sherry Johnson is an 26 year old gravida 1 para 0 at 23 weeks and 4 days estimated gestational age who presents for cesarean section. Please see the patient's history and physical for full details of her history.  Following the appropriate informed consent the patient was brought to the operating room where spinal anesthesia was administered and found to be adequate. She was placed in the dorsal supine position with a leftward tilt. She was prepped and draped in the normal sterile fashion. The patient was appropriately identified in a preoperative timeout procedure. The scalpel was then used to make a Pfannenstiel skin incision which was carried down to the underlying layers of soft tissue to the fascia. The fascia was incised in the midline and the fascial incision was extended laterally with Mayo scissors. The superior aspect of the fascial incision was grasped with Coker clamps x2, tented up and the rectus muscles dissected off sharply with the electrocautery unit area and the same procedure was repeated on the inferior aspect of the fascial incision. The rectus muscles were separated in the midline. The abdominal peritoneum was identified, tented up, entered sharply, and the  incision was extended superiorly and inferiorly with good visualization of the bladder. The Alexis retractor was then deployed. The vesicouterine peritoneum was identified, tented up, entered sharply, and the bladder flap was created digitally. Scalpel was then used to make a low transverse incision on the uterus which was extended laterally with blunt dissection. The fetal breech was identified, delivered easily through the uterine incision followed by the body and head. The infant was bulb suctioned on the operative field and cried vigorously. Following a 1 minute delay in cord clamping, the cord was clamped and cut and the infant was passed to the waiting neonatologist. Placenta was then delivered spontaneously, the uterus was cleared of all clot and debris. The uterine incision was repaired with #1 chromic in running locked fashion followed by a second imbricating layer. The right uterine artery was repaired with an Antony Salmon suture. Hematoma formation had occurred in the broad ligament. This was inspected and observed for some time and no additional expansion was noted. The Ovaries and tubes were inspected and normal. The Alexis retractor was removed. The abdominal peritoneum was reapproximated with 2-0 Vicryl in a running fashion, the rectus muscles was reapproximated with 2-0 chromic in a running fashion. The fascia was closed with a looped PDS in a running fashion. The skin was closed with 4-0 vicryl in a subcuticular fashion and Dermabond. All sponge lap and needle counts were correct x2. Patient tolerated the procedure well and recovered in stable condition following the procedure.

## 2017-07-13 NOTE — Transfer of Care (Signed)
Immediate Anesthesia Transfer of Care Note  Patient: Sherry Johnson  Procedure(s) Performed: Procedure(s): CESAREAN SECTION (N/A)  Patient Location: PACU  Anesthesia Type:Spinal  Level of Consciousness: awake  Airway & Oxygen Therapy: Patient Spontanous Breathing  Post-op Assessment: Report given to RN  Post vital signs: Reviewed and stable  Last Vitals:  Vitals:   07/13/17 0646 07/13/17 0649  BP: (!) 140/92   Pulse: (!) 104   Resp:  16  Temp:  37.1 C    Last Pain:  Vitals:   07/13/17 0649  TempSrc: Oral         Complications: No apparent anesthesia complications

## 2017-07-13 NOTE — Anesthesia Postprocedure Evaluation (Signed)
Anesthesia Post Note  Patient: Sherry Johnson  Procedure(s) Performed: Procedure(s) (LRB): CESAREAN SECTION (N/A)     Patient location during evaluation: PACU Anesthesia Type: Spinal Level of consciousness: oriented and awake and alert Pain management: pain level controlled Vital Signs Assessment: post-procedure vital signs reviewed and stable Respiratory status: spontaneous breathing, respiratory function stable and nonlabored ventilation Cardiovascular status: blood pressure returned to baseline and stable Postop Assessment: no headache, no backache, patient able to bend at knees, spinal receding and no signs of nausea or vomiting Anesthetic complications: no    Last Vitals:  Vitals:   07/13/17 1000 07/13/17 1015  BP: 129/81 123/82  Pulse: 66 69  Resp: 17 14  Temp:    SpO2: 98% 96%    Last Pain:  Vitals:   07/13/17 1000  TempSrc:   PainSc: 3    Pain Goal:                 [name]

## 2017-07-13 NOTE — Anesthesia Procedure Notes (Signed)
Spinal  Patient location during procedure: OR Start time: 07/13/2017 8:34 AM Staffing Anesthesiologist: Mal Amabile Performed: anesthesiologist  Preanesthetic Checklist Completed: patient identified, site marked, surgical consent, pre-op evaluation, timeout performed, IV checked, risks and benefits discussed and monitors and equipment checked Spinal Block Patient position: sitting Prep: site prepped and draped and DuraPrep Patient monitoring: heart rate, cardiac monitor, continuous pulse ox and blood pressure Approach: midline Location: L3-4 Injection technique: single-shot Needle Needle type: Pencan  Needle gauge: 24 G Needle length: 9 cm Needle insertion depth: 6 cm Assessment Sensory level: T4 Additional Notes Patient tolerated procedure well. Adequate sensory level.

## 2017-07-13 NOTE — Anesthesia Preprocedure Evaluation (Signed)
Anesthesia Evaluation  Patient identified by MRN, date of birth, ID band Patient awake    Reviewed: Allergy & Precautions, NPO status , Patient's Chart, lab work & pertinent test results  Airway Mallampati: III  TM Distance: >3 FB Neck ROM: Full    Dental no notable dental hx. (+) Teeth Intact   Pulmonary neg pulmonary ROS,    Pulmonary exam normal breath sounds clear to auscultation       Cardiovascular negative cardio ROS Normal cardiovascular exam Rhythm:Regular Rate:Normal     Neuro/Psych  Headaches, Pseudotumor cerebri negative psych ROS   GI/Hepatic GERD  Medicated and Controlled,  Endo/Other  diabetes, Well Controlled, GestationalObesity  Renal/GU Renal diseaseHx/o renal calculi  negative genitourinary   Musculoskeletal negative musculoskeletal ROS (+)   Abdominal (+) + obese,   Peds  Hematology   Anesthesia Other Findings   Reproductive/Obstetrics (+) Pregnancy Breech presentation Oligohydramnios                             Anesthesia Physical Anesthesia Plan  ASA: II  Anesthesia Plan: Spinal   Post-op Pain Management:    Induction:   PONV Risk Score and Plan: 4 or greater and Ondansetron, Dexamethasone, Midazolam, Scopolamine patch - Pre-op and Propofol infusion  Airway Management Planned: Natural Airway  Additional Equipment:   Intra-op Plan:   Post-operative Plan:   Informed Consent: I have reviewed the patients History and Physical, chart, labs and discussed the procedure including the risks, benefits and alternatives for the proposed anesthesia with the patient or authorized representative who has indicated his/her understanding and acceptance.   Dental advisory given  Plan Discussed with: CRNA, Anesthesiologist and Surgeon  Anesthesia Plan Comments:         Anesthesia Quick Evaluation

## 2017-07-13 NOTE — Lactation Note (Signed)
This note was copied from a baby's chart. Lactation Consultation Note  Patient Name: Sherry Johnson Today's Date: 07/13/2017 Reason for consult: Initial assessment;1st time breastfeeding;Term;[facility] Breastfeeding consultation services and support information given.  This is mom's first baby and newborn is 32 hours old.  Baby has been to breast three times.  Mom reports hearing swallows.  Baby just finished a feeding and sleepy and content.  Instructed to feed with feeding cues and call for assist prn.  Maternal Data    Feeding Feeding Type: Breast Fed Length of feed: 15 min  LATCH Score Latch: Grasps breast easily, tongue down, lips flanged, rhythmical sucking.  Audible Swallowing: A few with stimulation  Type of Nipple: Everted at rest and after stimulation  Comfort (Breast/Nipple): Soft / non-tender  Hold (Positioning): Assistance needed to correctly position infant at breast and maintain latch.  LATCH Score: 8  Interventions    Lactation Tools Discussed/Used     Consult Status Consult Status: Follow-up Date: 07/14/17 Follow-up type: In-patient    Huston Foley 07/13/2017, 3:16 PM

## 2017-07-13 NOTE — Progress Notes (Signed)
Patient had a current IV right forearm, infusing, clean dry and intact.

## 2017-07-14 ENCOUNTER — Encounter (HOSPITAL_COMMUNITY): Payer: Self-pay | Admitting: Obstetrics and Gynecology

## 2017-07-14 LAB — CBC
HEMATOCRIT: 27.1 % — AB (ref 36.0–46.0)
HEMOGLOBIN: 9.5 g/dL — AB (ref 12.0–15.0)
MCH: 31.7 pg (ref 26.0–34.0)
MCHC: 35.1 g/dL (ref 30.0–36.0)
MCV: 90.3 fL (ref 78.0–100.0)
Platelets: 179 10*3/uL (ref 150–400)
RBC: 3 MIL/uL — ABNORMAL LOW (ref 3.87–5.11)
RDW: 14.5 % (ref 11.5–15.5)
WBC: 12.8 10*3/uL — ABNORMAL HIGH (ref 4.0–10.5)

## 2017-07-14 MED ORDER — FERROUS SULFATE 325 (65 FE) MG PO TABS
325.0000 mg | ORAL_TABLET | Freq: Every day | ORAL | Status: DC
  Administered 2017-07-15: 325 mg via ORAL
  Filled 2017-07-14: qty 1

## 2017-07-14 NOTE — Progress Notes (Signed)
Patient is eating, ambulating, voiding.  Pain control is good.  Appropriate lochia, no complaints. +flatus.  Vitals:   07/13/17 1255 07/13/17 1650 07/13/17 2044 07/14/17 0050  BP: 118/66 112/63 (!) 106/55 (!) 99/54  Pulse: 64 64 67 79  Resp: 18 18 18 16   Temp: 98.6 F (37 C) 98.9 F (37.2 C) 99.3 F (37.4 C) 99.5 F (37.5 C)  TempSrc: Axillary Oral Oral Oral  SpO2: 99% 97% 99% 97%  Weight:      Height:        Fundus firm Perineum without swelling. Ext: no CT  Lab Results  Component Value Date   WBC 12.8 (H) 07/14/2017   HGB 9.5 (L) 07/14/2017   HCT 27.1 (L) 07/14/2017   MCV 90.3 07/14/2017   PLT 179 07/14/2017    --/--/A POS, A POS (08/20 1020)  A/P Post op day #1 s/p c/s for breech Doing well Post-op anemia- will add add'l FeSO4 qd.  Routine care.  Philip Aspen

## 2017-07-14 NOTE — Lactation Note (Signed)
This note was copied from a baby's chart. Lactation Consultation Note  Patient Name: Sherry Johnson Today's Date: 07/14/2017 Reason for consult: 1st time breastfeeding;Nipple pain/trauma;Primapara;Difficult latch;Follow-up assessment  Follow up visit at 30 hours of age.  Mom reports difficulty with baby latching and sore nipples.  Mom is attempting feeding now with shallow latch in football hold on left breast.  Mom noted to have several bruised areas to areolas.  Mom noted to have flat nipple when baby pulled off nipple.  LC assisted with pillow support and positioning.  LC assisted with hand expression of several small drops.  LC assisted with latching.  Baby will latch with wide gape, but does not maintain the depth with sucking.  Baby is slipping to tip of nipple.  LC allowed baby to suck gloved finger, baby is tongue thrusting. LC offered to assist with hand expression and spoon feeding of a few mls.  Baby acts more content, but is sucking thumb and fist.   LC set up hand pump to have mom prepump to help evert nipples and use as needed.  LC provided instructions on cleaning and milk storage. LC provided colostrum containers as well.   LC gave mom comfort gels to use when she has a bra on due to soreness.   Mom was able to hand express an additional few drops, but did not return demonstration of spoon feeding at this time.  Mom reports she is tired and needs a break.  Family at bedside holding fussy baby. [facility] LC resources given and discussed.  Encouraged to feed with early cues on demand.  Early newborn behavior discussed.    Mom to call for assist as needed.   Follow up consult may consider a nipple shield to help with latching and soreness.     Maternal Data Has patient been taught Hand Expression?: Yes Does the patient have breastfeeding experience prior to this delivery?: No  Feeding Feeding Type: Breast Fed Length of feed:  (few minutes)  LATCH Score Latch: Repeated attempts  needed to sustain latch, nipple held in mouth throughout feeding, stimulation needed to elicit sucking reflex.  Audible Swallowing: A few with stimulation  Type of Nipple: Flat  Comfort (Breast/Nipple): Filling, red/small blisters or bruises, mild/mod discomfort  Hold (Positioning): Assistance needed to correctly position infant at breast and maintain latch.  LATCH Score: 5  Interventions Interventions: Breast feeding basics reviewed;Support pillows;Position options;Assisted with latch;Skin to skin;Expressed milk;Breast massage;Hand express;Comfort gels;Pre-pump if needed;Hand pump;Breast compression;Adjust position  Lactation Tools Discussed/Used Pump Review: Setup, frequency, and cleaning;Milk Storage Initiated by:: JS IBCLC Date initiated:: 07/14/17   Consult Status Consult Status: Follow-up Date: 07/15/17 Follow-up type: In-patient    Franz Dell 07/14/2017, 3:33 PM

## 2017-07-15 MED ORDER — OXYCODONE-ACETAMINOPHEN 5-325 MG PO TABS: 2.0000 | ORAL_TABLET | ORAL | 0 refills | Status: AC | PRN

## 2017-07-15 NOTE — Discharge Summary (Signed)
Obstetric Discharge Summary Reason for Admission: cesarean section Prenatal Procedures: ultrasound Intrapartum Procedures: cesarean: low cervical, transverse Postpartum Procedures: none Complications-Operative and Postpartum: none Hemoglobin  Date Value Ref Range Status  07/14/2017 9.5 (L) 12.0 - 15.0 g/dL Final    Comment:    REPEATED TO VERIFY DELTA CHECK NOTED    HCT  Date Value Ref Range Status  07/14/2017 27.1 (L) 36.0 - 46.0 % Final    Discharge Diagnoses: Term Pregnancy-delivered  Discharge Information: Date: 07/15/2017 Activity: pelvic rest Diet: routine Medications: Iron and Percocet Condition: stable Instructions: refer to practice specific booklet Discharge to: home Follow-up Information    Waynard Reeds, MD Follow up in 4 week(s).   Specialty:  Obstetrics and Gynecology Contact information: [address] [place] [address] [phone]           Newborn Data: Live born female  Birth Weight: 7 lb 7.6 oz (3390 g) APGAR: 9, 9  Home with mother.  Aneesha Holloran A 07/15/2017, 7:44 AM

## 2017-07-15 NOTE — Progress Notes (Signed)
  Patient is eating, ambulating, voiding.  Pain control is good.  Vitals:   07/13/17 2044 07/14/17 0050 07/14/17 1734 07/15/17 0601  BP: (!) 106/55 (!) 99/54 132/77 120/75  Pulse: 67 79 80 75  Resp: 18 16 18 18   Temp: 99.3 F (37.4 C) 99.5 F (37.5 C) 98.2 F (36.8 C) 97.8 F (36.6 C)  TempSrc: Oral Oral Axillary   SpO2: 99% 97% 99%   Weight:      Height:        lungs:   clear to auscultation cor:    RRR Abdomen:  soft, appropriate tenderness, incisions intact and without erythema or exudate ex:    no cords   Lab Results  Component Value Date   WBC 12.8 (H) 07/14/2017   HGB 9.5 (L) 07/14/2017   HCT 27.1 (L) 07/14/2017   MCV 90.3 07/14/2017   PLT 179 07/14/2017    --/--/A POS, A POS (08/20 1020)/RI  A/P    Post operative day 2.  Routine post op and postpartum care.  Expect d/c today.  Iron.  Percocet for pain control.

## 2017-07-15 NOTE — Lactation Note (Signed)
This note was copied from a baby's chart. Lactation Consultation Note  Patient Name: Girl Meeyah Freeman Today's Date: 07/15/2017 Reason for consult: Follow-up assessment;Nipple pain/trauma;Primapara;1st time breastfeeding   Follow up with mom of 50 hour old infant. Infant with 7 BF for 13-40 minutes, 1 BF attempt, EBM x 2 (5cc) via apoon, 4 voids and 3 stools in last 24 hours. LATCH scores 5-7. Infant weight 6 lb 15.3 oz with 7% weight loss since birth.   Mom had infant latched to left breast in the cradle hold when [facility] entered room. Infant was noted to be shallow and with cheek dimpling. Mom relatched infant and she was on and off the breast. Assisted mom in relatching infant using the cross cradle and teacup holds. Infant did latch deeper and fed with more consistent rhythm. She then came on and off with mom relatching as needed. Worked with mom on the teacup hold and she had some difficulty with the cross cradle hold and teacup hold by herself. She then placed the infant in the football hold and latched her independently with the teacup hold and infant fed well with multiple swallows noted. Mom reports her nipples feel better today and feels BF is improving. Mom was able to detect infant swallows. Mom with soft compressible breasts and areola with flat nipples at rest. Nipples do every slightly with stimulation. Mom is using the hand pump to every nipples prior to latch.   Enc mom to continue feeding infant STS 8-12 x in 24 hours at first feeding cues. Enc mom to use awakening techniques while at breast as needed. Mom reports at times BF is frustrating, enc mom to pump and supplement infant as needed, especially if infant will not latch or latches for short periods of time. Mom reports she is aware of how to spoon feed and that infant was content after spoon feeding. Reviewed increasing volumes of feedings with each new day. Mom has a pump at home from her sister, she is unsure of brand. Sounds like it  could be a Medela and mom was shown how to modify her hospital tubing to work with that pump if it is a Medela. Mom does have a manual pump to take home.   Reviewed I/O, Breast milk handling and storage, and engorgement prevention/treatment with mom. Infant with follow up Ped appt tomorrow at 3 and a [facility] appt for Tuesday. Enc mom to call with any questions/concerns. Offered mom follow up OP LC appt and she declined, she has phone # to call and schedule if she would like. Reviewed BF Support Groups with mom.   Mom without further questions/concerns at this time. Enc mom to call with any questions/concerns prn.    Maternal Data Formula Feeding for Exclusion: No Has patient been taught Hand Expression?: Yes Does the patient have breastfeeding experience prior to this delivery?: No  Feeding Feeding Type: Breast Fed Length of feed: 20 min  LATCH Score Latch: Repeated attempts needed to sustain latch, nipple held in mouth throughout feeding, stimulation needed to elicit sucking reflex.  Audible Swallowing: Spontaneous and intermittent  Type of Nipple: Flat  Comfort (Breast/Nipple): Filling, red/small blisters or bruises, mild/mod discomfort (mom reports nipples are feeling better today, bruises noted to areolar area)  Hold (Positioning): Assistance needed to correctly position infant at breast and maintain latch.  LATCH Score: 6  Interventions Interventions: Breast feeding basics reviewed;Support pillows;Assisted with latch;Position options;Skin to skin;Expressed milk;Pre-pump if needed;Breast compression;Adjust position  Lactation Tools Discussed/Used WIC Program: Yes  Pump Review: Setup, frequency, and cleaning;Milk Storage   Consult Status Consult Status: Complete Follow-up type: Call as needed    Ed Blalock 07/15/2017, 12:15 PM

## 2019-07-14 DIAGNOSIS — O26841 Uterine size-date discrepancy, first trimester: Secondary | ICD-10-CM | POA: Diagnosis not present

## 2019-07-26 DIAGNOSIS — O3680X9 Pregnancy with inconclusive fetal viability, other fetus: Secondary | ICD-10-CM | POA: Diagnosis not present

## 2019-07-26 DIAGNOSIS — Z124 Encounter for screening for malignant neoplasm of cervix: Secondary | ICD-10-CM | POA: Diagnosis not present

## 2019-08-23 DIAGNOSIS — Z3480 Encounter for supervision of other normal pregnancy, unspecified trimester: Secondary | ICD-10-CM | POA: Diagnosis not present

## 2019-08-23 DIAGNOSIS — Z3481 Encounter for supervision of other normal pregnancy, first trimester: Secondary | ICD-10-CM | POA: Diagnosis not present

## 2019-10-17 DIAGNOSIS — Z363 Encounter for antenatal screening for malformations: Secondary | ICD-10-CM | POA: Diagnosis not present

## 2019-11-14 DIAGNOSIS — R8271 Bacteriuria: Secondary | ICD-10-CM | POA: Diagnosis not present

## 2019-12-15 DIAGNOSIS — Z3480 Encounter for supervision of other normal pregnancy, unspecified trimester: Secondary | ICD-10-CM | POA: Diagnosis not present

## 2019-12-15 DIAGNOSIS — Z23 Encounter for immunization: Secondary | ICD-10-CM | POA: Diagnosis not present

## 2019-12-15 DIAGNOSIS — O4403 Placenta previa specified as without hemorrhage, third trimester: Secondary | ICD-10-CM | POA: Diagnosis not present

## 2019-12-21 DIAGNOSIS — O99810 Abnormal glucose complicating pregnancy: Secondary | ICD-10-CM | POA: Diagnosis not present

## 2020-01-11 DIAGNOSIS — Z3480 Encounter for supervision of other normal pregnancy, unspecified trimester: Secondary | ICD-10-CM | POA: Diagnosis not present
